# Patient Record
Sex: Female | Born: 1935 | Race: White | Hispanic: No | Smoking: Never smoker
Health system: Southern US, Community
[De-identification: ages and names within clinical notes are randomized; demographics above are authoritative.]

## PROBLEM LIST (undated history)

## (undated) DIAGNOSIS — E785 Hyperlipidemia, unspecified: Secondary | ICD-10-CM

## (undated) DIAGNOSIS — E039 Hypothyroidism, unspecified: Secondary | ICD-10-CM

## (undated) DIAGNOSIS — E119 Type 2 diabetes mellitus without complications: Secondary | ICD-10-CM

## (undated) DIAGNOSIS — N183 Chronic kidney disease, stage 3 unspecified: Secondary | ICD-10-CM

## (undated) DIAGNOSIS — Z95818 Presence of other cardiac implants and grafts: Secondary | ICD-10-CM

## (undated) DIAGNOSIS — I4891 Unspecified atrial fibrillation: Secondary | ICD-10-CM

## (undated) HISTORY — DX: Chronic kidney disease, stage 3 unspecified: N18.30

## (undated) HISTORY — DX: Hyperlipidemia, unspecified: E78.5

## (undated) HISTORY — DX: Hypothyroidism, unspecified: E03.9

## (undated) HISTORY — DX: Presence of other cardiac implants and grafts: Z95.818

## (undated) HISTORY — DX: Type 2 diabetes mellitus without complications: E11.9

## (undated) HISTORY — DX: Unspecified atrial fibrillation: I48.91

---

## 2020-08-13 ENCOUNTER — Other Ambulatory Visit (HOSPITAL_COMMUNITY): Payer: Self-pay

## 2020-08-13 MED ORDER — POTASSIUM CHLORIDE CRYS ER 10 MEQ PO TBCR
EXTENDED_RELEASE_TABLET | ORAL | 0 refills | Status: DC
  Filled 2020-08-13: qty 30, 30d supply, fill #0
  Filled 2020-09-08: qty 30, 30d supply, fill #1
  Filled 2020-10-12: qty 30, 30d supply, fill #2

## 2020-08-13 MED ORDER — METOPROLOL SUCCINATE ER 25 MG PO TB24
25.0000 mg | ORAL_TABLET | Freq: Every day | ORAL | 0 refills | Status: DC
  Filled 2020-08-13: qty 30, 30d supply, fill #0
  Filled 2020-09-08: qty 30, 30d supply, fill #1
  Filled 2020-10-12: qty 30, 30d supply, fill #2

## 2020-08-13 MED ORDER — LEVOTHYROXINE SODIUM 88 MCG PO TABS
ORAL_TABLET | ORAL | 0 refills | Status: DC
  Filled 2020-08-13: qty 30, 30d supply, fill #0
  Filled 2020-09-08: qty 30, 30d supply, fill #1
  Filled 2020-10-12: qty 30, 30d supply, fill #2

## 2020-08-13 MED ORDER — ALIGN 4 MG PO CAPS: ORAL_CAPSULE | ORAL | 0 refills | Status: AC

## 2020-08-13 MED ORDER — SPIRONOLACTONE 25 MG PO TABS
ORAL_TABLET | ORAL | 0 refills | Status: DC
  Filled 2020-08-13: qty 30, 30d supply, fill #0
  Filled 2020-09-08: qty 30, 30d supply, fill #1
  Filled 2020-10-12: qty 30, 30d supply, fill #2

## 2020-08-13 MED ORDER — FARXIGA 5 MG PO TABS
5.0000 mg | ORAL_TABLET | Freq: Every day | ORAL | 0 refills | Status: DC
  Filled 2020-08-13: qty 30, 30d supply, fill #0
  Filled 2020-09-08: qty 30, 30d supply, fill #1
  Filled 2020-10-12: qty 30, 30d supply, fill #2

## 2020-08-13 MED ORDER — LEVOTHYROXINE SODIUM 88 MCG PO CAPS
ORAL_CAPSULE | ORAL | 0 refills | Status: DC
  Filled 2020-08-13: qty 90, 90d supply, fill #0

## 2020-08-13 MED ORDER — TORSEMIDE 10 MG PO TABS
ORAL_TABLET | ORAL | 0 refills | Status: DC
  Filled 2020-08-13: qty 30, 30d supply, fill #0
  Filled 2020-09-08: qty 30, 30d supply, fill #1
  Filled 2020-10-12: qty 30, 30d supply, fill #2

## 2020-08-13 MED ORDER — BUPROPION HCL ER (XL) 150 MG PO TB24
150.0000 mg | ORAL_TABLET | Freq: Every day | ORAL | 0 refills | Status: DC
  Filled 2020-08-13: qty 30, 30d supply, fill #0
  Filled 2020-09-08: qty 30, 30d supply, fill #1
  Filled 2020-10-12: qty 30, 30d supply, fill #2

## 2020-08-14 ENCOUNTER — Other Ambulatory Visit (HOSPITAL_COMMUNITY): Payer: Self-pay

## 2020-08-15 ENCOUNTER — Other Ambulatory Visit (HOSPITAL_COMMUNITY): Payer: Self-pay

## 2020-08-16 ENCOUNTER — Other Ambulatory Visit (HOSPITAL_COMMUNITY): Payer: Self-pay

## 2020-08-18 ENCOUNTER — Other Ambulatory Visit (HOSPITAL_COMMUNITY): Payer: Self-pay

## 2020-08-19 ENCOUNTER — Other Ambulatory Visit (HOSPITAL_COMMUNITY): Payer: Self-pay

## 2020-09-08 ENCOUNTER — Other Ambulatory Visit (HOSPITAL_COMMUNITY): Payer: Self-pay

## 2020-09-15 ENCOUNTER — Other Ambulatory Visit (HOSPITAL_COMMUNITY): Payer: Self-pay

## 2020-09-16 ENCOUNTER — Other Ambulatory Visit (HOSPITAL_COMMUNITY): Payer: Self-pay

## 2020-10-13 ENCOUNTER — Other Ambulatory Visit (HOSPITAL_COMMUNITY): Payer: Self-pay

## 2020-10-14 ENCOUNTER — Other Ambulatory Visit (HOSPITAL_COMMUNITY): Payer: Self-pay

## 2020-10-17 ENCOUNTER — Other Ambulatory Visit (HOSPITAL_COMMUNITY): Payer: Self-pay

## 2020-10-17 MED ORDER — METOPROLOL SUCCINATE ER 25 MG PO TB24
25.0000 mg | ORAL_TABLET | Freq: Every day | ORAL | 1 refills | Status: AC
  Filled 2020-10-17 – 2020-11-11 (×2): qty 30, 30d supply, fill #0
  Filled 2020-12-06: qty 30, 30d supply, fill #1
  Filled 2021-01-13: qty 30, 30d supply, fill #2
  Filled 2021-02-13: qty 30, 30d supply, fill #3
  Filled 2021-03-15: qty 30, 30d supply, fill #4

## 2020-10-17 MED ORDER — SPIRONOLACTONE 25 MG PO TABS
25.0000 mg | ORAL_TABLET | Freq: Every day | ORAL | 1 refills | Status: AC
  Filled 2020-10-17 – 2020-11-11 (×2): qty 30, 30d supply, fill #0
  Filled 2020-12-06: qty 30, 30d supply, fill #1
  Filled 2021-01-13: qty 30, 30d supply, fill #2
  Filled 2021-02-13: qty 30, 30d supply, fill #3
  Filled 2021-03-15: qty 30, 30d supply, fill #4

## 2020-10-17 MED ORDER — TORSEMIDE 10 MG PO TABS
10.0000 mg | ORAL_TABLET | Freq: Every day | ORAL | 1 refills | Status: AC
  Filled 2020-10-17 – 2020-11-11 (×2): qty 30, 30d supply, fill #0
  Filled 2020-12-06: qty 30, 30d supply, fill #1
  Filled 2021-01-13: qty 30, 30d supply, fill #2
  Filled 2021-02-13: qty 30, 30d supply, fill #3
  Filled 2021-03-15: qty 30, 30d supply, fill #4

## 2020-10-17 MED ORDER — MYRBETRIQ 50 MG PO TB24
50.0000 mg | ORAL_TABLET | Freq: Every day | ORAL | 1 refills | Status: AC
  Filled 2020-10-17: qty 30, 30d supply, fill #0
  Filled 2020-11-11: qty 30, 30d supply, fill #1
  Filled 2021-01-13: qty 30, 30d supply, fill #2
  Filled 2021-02-13: qty 30, 30d supply, fill #3
  Filled 2021-03-15: qty 30, 30d supply, fill #4

## 2020-10-17 MED ORDER — ROSUVASTATIN CALCIUM 20 MG PO TABS
20.0000 mg | ORAL_TABLET | Freq: Every day | ORAL | 1 refills | Status: AC
  Filled 2020-10-17: qty 30, 30d supply, fill #0

## 2020-10-17 MED ORDER — METFORMIN HCL ER 500 MG PO TB24
ORAL_TABLET | ORAL | 1 refills | Status: DC
  Filled 2020-10-17: qty 30, 30d supply, fill #0

## 2020-10-17 MED ORDER — BUPROPION HCL ER (XL) 300 MG PO TB24
ORAL_TABLET | ORAL | 1 refills | Status: AC
  Filled 2020-10-17: qty 30, 30d supply, fill #0
  Filled 2020-11-25: qty 30, 30d supply, fill #1
  Filled 2020-12-22: qty 30, 30d supply, fill #2
  Filled 2021-02-01: qty 30, 30d supply, fill #3
  Filled 2021-03-02: qty 30, 30d supply, fill #4

## 2020-10-17 MED ORDER — LEVOTHYROXINE SODIUM 88 MCG PO TABS
ORAL_TABLET | ORAL | 1 refills | Status: AC
  Filled 2020-10-17 – 2020-11-11 (×2): qty 30, 30d supply, fill #0
  Filled 2020-12-08: qty 30, 30d supply, fill #1
  Filled 2021-01-19: qty 30, 30d supply, fill #2
  Filled 2021-02-16: qty 30, 30d supply, fill #3

## 2020-10-17 MED ORDER — DULOXETINE HCL 60 MG PO CPEP
ORAL_CAPSULE | ORAL | 1 refills | Status: AC
  Filled 2020-10-17: qty 30, 30d supply, fill #0
  Filled 2021-01-13: qty 30, 30d supply, fill #1
  Filled 2021-02-13: qty 30, 30d supply, fill #2

## 2020-10-17 MED ORDER — POTASSIUM CHLORIDE ER 10 MEQ PO TBCR
10.0000 meq | EXTENDED_RELEASE_TABLET | Freq: Two times a day (BID) | ORAL | 1 refills | Status: DC
  Filled 2020-10-17: qty 60, 30d supply, fill #0

## 2020-10-17 MED ORDER — GABAPENTIN 300 MG PO CAPS
ORAL_CAPSULE | ORAL | 1 refills | Status: AC
  Filled 2020-10-17: qty 30, 30d supply, fill #0
  Filled 2020-11-14: qty 30, 30d supply, fill #1
  Filled 2020-12-15: qty 30, 30d supply, fill #2
  Filled 2021-01-19: qty 30, 30d supply, fill #3
  Filled 2021-02-23: qty 30, 30d supply, fill #4

## 2020-10-21 ENCOUNTER — Other Ambulatory Visit (HOSPITAL_COMMUNITY): Payer: Self-pay

## 2020-10-21 MED ORDER — POTASSIUM CHLORIDE ER 10 MEQ PO TBCR
EXTENDED_RELEASE_TABLET | ORAL | 1 refills | Status: AC
  Filled 2021-01-14: qty 30, 30d supply, fill #0
  Filled 2021-02-13: qty 30, 30d supply, fill #1
  Filled 2021-03-15: qty 30, 30d supply, fill #2

## 2020-10-22 ENCOUNTER — Other Ambulatory Visit (HOSPITAL_COMMUNITY): Payer: Self-pay

## 2020-10-22 MED ORDER — RYBELSUS 3 MG PO TABS
ORAL_TABLET | ORAL | 0 refills | Status: DC
  Filled 2020-10-22: qty 30, 30d supply, fill #0

## 2020-10-22 MED ORDER — RYBELSUS 7 MG PO TABS
ORAL_TABLET | ORAL | 5 refills | Status: DC
  Filled 2020-10-22: qty 30, 30d supply, fill #0
  Filled 2020-12-22: qty 30, 30d supply, fill #1
  Filled 2021-01-19: qty 30, 30d supply, fill #2

## 2020-11-11 ENCOUNTER — Other Ambulatory Visit (HOSPITAL_COMMUNITY): Payer: Self-pay

## 2020-11-14 ENCOUNTER — Other Ambulatory Visit (HOSPITAL_COMMUNITY): Payer: Self-pay

## 2020-11-25 ENCOUNTER — Other Ambulatory Visit (HOSPITAL_COMMUNITY): Payer: Self-pay

## 2020-12-03 NOTE — Progress Notes (Signed)
Cardiology Office Note:    Date:  12/05/2020   ID:  Kim Gonzalez, DOB 25-Jul-1928, MRN [id number]  PCP:  Pcp, No   [facility] Providers Cardiologist:  None     Referring MD: Wilfrid Lund, PA   No chief complaint on file. Establish care  History of Present Illness:    Kim Gonzalez is a 87 y.o. female with a hx of pAF s/p watchman 2019, T2DM (on metformin) a1c 8.1  (09/2020), CKD, HLD (LDL at goal 61 09/2020), hypothyroidism (TSH 1.3), crt 1.3  who presents to establish care with cardiology  In terms of her cardiac history starts in [place]. It's been many years. She was diagnosed with atrial fibrillation in 2018. Care was with Dr. Maggie Schwalbe [facility] No history of DCCV. She was managed with BB. She was managed on xarelto, unknown why stopped. She underwent watchman device. Dr. Billie Lade. She recalls a possible TEE  6 months later and was told everything looked good.  She was noted to have a pericardial effusion around this time s/p pericardiocentesis. Etiology is unknown.No CHF history. No history CVD. No smoking history. She's asymptomatic and does not know when she is in atrial fibrillation.She was on oxycodone in the past and it cause her to pass out. Otherwise no syncope. She was in NSR in March 2022.   She's taking spironolactone 25 mg daily, torsemide  10 mg daily, metoprolol XL 25 mg, rosuvastatin 20 mg daily  She sits in her chair all day and has started with physical therapy. Lives with her son. She denies angina,mild dyspnea with exercises which improves with water. She denies lower extremity edema, PND or orthopnea.     Current Medications: Current Meds  Medication Sig   buPROPion (WELLBUTRIN XL) 300 MG 24 hr tablet Take 1 tablet by mouth once daily in the morning.   DULoxetine (CYMBALTA) 60 MG capsule Take 1 capsule by mouth once daily.   gabapentin (NEURONTIN) 300 MG capsule Take 1 capsule by mouth once daily at bedtime.   levothyroxine  (SYNTHROID) 88 MCG tablet Take 1 tablet by mouth in the morning on an empty stomach   metFORMIN (GLUCOPHAGE-XR) 500 MG 24 hr tablet Take 1 tablet by mouth with evening meal   metoprolol succinate (TOPROL-XL) 25 MG 24 hr tablet Take 1 tablet (25 mg total) by mouth daily.   mirabegron ER (MYRBETRIQ) 50 MG TB24 tablet Take 1 tablet (50 mg total) by mouth daily.   potassium chloride (KLOR-CON) 10 MEQ tablet Take 1 tablet by mouth once daily with food   Probiotic Product (ALIGN) 4 MG CAPS Take one capsule by mouth daily.   rosuvastatin (CRESTOR) 20 MG tablet Take 1 tablet (20 mg total) by mouth daily.   Semaglutide (RYBELSUS) 7 MG TABS Take 1 tablet by mouth at least 30 minutes before first food, beverage or other oral medicine of the day once daily.Marland Kitchen start one month after taking 3mg  30 day(s)   spironolactone (ALDACTONE) 25 MG tablet Take 1 tablet (25 mg total) by mouth daily.   torsemide (DEMADEX) 10 MG tablet Take 1 tablet (10 mg total) by mouth daily.     Allergies:   Patient has no allergy information on record.   Social History   Socioeconomic History   Marital status: Unknown    Spouse name: Not on file   Number of children: Not on file   Years of education: Not on file   Highest education level: Not on file  Occupational  History   Not on file  Tobacco Use   Smoking status: Never   Smokeless tobacco: Never  Substance and Sexual Activity   Alcohol use: Never   Drug use: Never   Sexual activity: Not Currently  Other Topics Concern   Not on file  Social History Narrative   Not on file   Social Determinants of Health   Financial Resource Strain: Not on file  Food Insecurity: Not on file  Transportation Needs: Not on file  Physical Activity: Not on file  Stress: Not on file  Social Connections: Not on file     Family History: The patient's mother had an MI closed to 26,  sister with heart disease, sister 88 with Alzheimer's dimensia  ROS:   Please see the history of  present illness.     All other systems reviewed and are negative.  EKGs/Labs/Other Studies Reviewed:    The following studies were reviewed today:   EKG:  EKG is  ordered today.  The ekg ordered today demonstrates   12/05/2020- atrial fibrillation rate 90s 05/22/2020- NSR with PACs, Qtc 442 ms  Recent Labs: No results found for requested labs within last 8760 hours.  Recent Lipid Panel No results found for: CHOL, TRIG, HDL, CHOLHDL, VLDL, LDLCALC, LDLDIRECT   Risk Assessment/Calculations:    CHA2DS2-VASc Score = 4   This indicates a 4.8% annual risk of stroke. The patient's score is based upon: CHF History: 0 HTN History: 0 Diabetes History: 1 Stroke History: 0 Vascular Disease History: 0 Age Score: 2 Gender Score: 1         Physical Exam:    VS:  BP 127/78   Pulse 93   Ht 5\' 4"  (1.626 m)   Wt 225 lb (102.1 kg)   SpO2 97%   BMI 38.62 kg/m     Wt Readings from Last 3 Encounters:  12/05/20 225 lb (102.1 kg)     GEN: Sitting in a wheelchair. Conversive. Well nourished, well developed in no acute distress HEENT: Normal NECK: No JVD;  LYMPHATICS: No lymphadenopathy CARDIAC: irregularly irregular, no murmurs, rubs, gallops RESPIRATORY:  Clear to auscultation without rales, wheezing or rhonchi  ABDOMEN: Soft, non-tender, non-distended MUSCULOSKELETAL:  No edema; No deformity  SKIN: Warm and dry NEUROLOGIC:  Alert and oriented x 3 PSYCHIATRIC:  Normal affect   ASSESSMENT:   #Paroxysmal Atrial Fibrillation: s/p watchman 2019 placed in [place]; [place]. She noted a follow up TEE. She is rate controlled an asymptomatic. TSH was normal August, 2022. She was in normal rhythm in March. Will continue rate control for now. We discussed possibility of DCCV and other medications if she developed RVR, CHF etc. She agreed with not aggressively managing at this time.Will get an echo for LV function/LA size etc. -continue BB -no NOAC s/p  watchman  #HTN- well controlled. Continue spironolactone. On low dose torsemide (no noted hx of CHF)  PLAN:    In order of problems listed above:  Complete echocardiogram Follow up in 6 months    Medication Adjustments/Labs and Tests Ordered: Current medicines are reviewed at length with the patient today.  Concerns regarding medicines are outlined above.    Signed, April, MD  12/05/2020 11:56 AM    [facility]

## 2020-12-05 ENCOUNTER — Other Ambulatory Visit: Payer: Self-pay

## 2020-12-05 ENCOUNTER — Ambulatory Visit (INDEPENDENT_AMBULATORY_CARE_PROVIDER_SITE_OTHER): Payer: Medicare Other | Admitting: Internal Medicine

## 2020-12-05 ENCOUNTER — Encounter: Payer: Self-pay | Admitting: Internal Medicine

## 2020-12-05 VITALS — BP 127/78 | HR 93 | Ht 64.0 in | Wt 225.0 lb

## 2020-12-05 DIAGNOSIS — I4891 Unspecified atrial fibrillation: Secondary | ICD-10-CM | POA: Insufficient documentation

## 2020-12-05 DIAGNOSIS — Z95818 Presence of other cardiac implants and grafts: Secondary | ICD-10-CM | POA: Diagnosis not present

## 2020-12-05 DIAGNOSIS — Z7689 Persons encountering health services in other specified circumstances: Secondary | ICD-10-CM | POA: Diagnosis not present

## 2020-12-05 DIAGNOSIS — E669 Obesity, unspecified: Secondary | ICD-10-CM | POA: Insufficient documentation

## 2020-12-05 DIAGNOSIS — I10 Essential (primary) hypertension: Secondary | ICD-10-CM | POA: Insufficient documentation

## 2020-12-05 NOTE — Patient Instructions (Signed)
Medication Instructions:  CONTINUE WITH MEDICATIONS *If you need a refill on your cardiac medications before your next appointment, please call your pharmacy*   Testing/Procedures: Your physician has requested that you have an echocardiogram. Echocardiography is a painless test that uses sound waves to create images of your heart. It provides your doctor with information about the size and shape of your heart and how well your heart's chambers and valves are working. This procedure takes approximately one hour. There are no restrictions for this procedure. [address]   Follow-Up: At [facility], you and your health needs are our priority.  As part of our continuing mission to provide you with exceptional heart care, we have created designated Provider Care Teams.  These Care Teams include your primary Cardiologist (physician) and Advanced Practice Providers (APPs -  Physician Assistants and Nurse Practitioners) who all work together to provide you with the care you need, when you need it.  We recommend signing up for the patient portal called "MyChart".  Sign up information is provided on this After Visit Summary.  MyChart is used to connect with patients for Virtual Visits (Telemedicine).  Patients are able to view lab/test results, encounter notes, upcoming appointments, etc.  Non-urgent messages can be sent to your provider as well.   To learn more about what you can do with MyChart, go to [email or web address].    Your next appointment:   6 month(s)  The format for your next appointment:   In Person  Provider:   DR. Wyline Mood

## 2020-12-08 ENCOUNTER — Other Ambulatory Visit (HOSPITAL_COMMUNITY): Payer: Self-pay

## 2020-12-10 ENCOUNTER — Other Ambulatory Visit (HOSPITAL_COMMUNITY): Payer: Self-pay

## 2020-12-11 ENCOUNTER — Other Ambulatory Visit (HOSPITAL_COMMUNITY): Payer: Self-pay

## 2020-12-16 ENCOUNTER — Other Ambulatory Visit (HOSPITAL_COMMUNITY): Payer: Self-pay

## 2020-12-19 ENCOUNTER — Other Ambulatory Visit: Payer: Self-pay

## 2020-12-19 ENCOUNTER — Ambulatory Visit (HOSPITAL_COMMUNITY): Payer: Medicare Other | Attending: Cardiology

## 2020-12-19 DIAGNOSIS — I4891 Unspecified atrial fibrillation: Secondary | ICD-10-CM | POA: Insufficient documentation

## 2020-12-19 LAB — ECHOCARDIOGRAM COMPLETE: S' Lateral: 3.4 cm

## 2020-12-22 ENCOUNTER — Other Ambulatory Visit (HOSPITAL_COMMUNITY): Payer: Self-pay

## 2020-12-30 ENCOUNTER — Encounter (HOSPITAL_COMMUNITY): Payer: Self-pay

## 2020-12-30 ENCOUNTER — Emergency Department (HOSPITAL_COMMUNITY): Payer: Medicare Other

## 2020-12-30 ENCOUNTER — Emergency Department (HOSPITAL_COMMUNITY)
Admission: EM | Admit: 2020-12-30 | Discharge: 2020-12-30 | Disposition: A | Discharge status: Home / Self Care | Payer: Medicare Other | Attending: Physician Assistant | Admitting: Physician Assistant

## 2020-12-30 ENCOUNTER — Other Ambulatory Visit (HOSPITAL_COMMUNITY): Payer: Self-pay

## 2020-12-30 DIAGNOSIS — M6281 Muscle weakness (generalized): Secondary | ICD-10-CM | POA: Diagnosis not present

## 2020-12-30 DIAGNOSIS — Z5321 Procedure and treatment not carried out due to patient leaving prior to being seen by health care provider: Secondary | ICD-10-CM | POA: Diagnosis not present

## 2020-12-30 DIAGNOSIS — R3981 Functional urinary incontinence: Secondary | ICD-10-CM | POA: Diagnosis not present

## 2020-12-30 DIAGNOSIS — R350 Frequency of micturition: Secondary | ICD-10-CM | POA: Insufficient documentation

## 2020-12-30 DIAGNOSIS — Z993 Dependence on wheelchair: Secondary | ICD-10-CM | POA: Diagnosis not present

## 2020-12-30 DIAGNOSIS — Z5181 Encounter for therapeutic drug level monitoring: Secondary | ICD-10-CM | POA: Diagnosis not present

## 2020-12-30 DIAGNOSIS — R82998 Other abnormal findings in urine: Secondary | ICD-10-CM | POA: Insufficient documentation

## 2020-12-30 DIAGNOSIS — R079 Chest pain, unspecified: Secondary | ICD-10-CM | POA: Diagnosis present

## 2020-12-30 LAB — CBC WITH DIFFERENTIAL/PLATELET
Abs Immature Granulocytes: 0.09 10*3/uL — ABNORMAL HIGH (ref 0.00–0.07)
Basophils Absolute: 0.1 10*3/uL (ref 0.0–0.1)
Basophils Relative: 0 %
Eosinophils Absolute: 0 10*3/uL (ref 0.0–0.5)
Eosinophils Relative: 0 %
HCT: 45.8 % (ref 36.0–46.0)
Hemoglobin: 14.5 g/dL (ref 12.0–15.0)
Immature Granulocytes: 1 %
Lymphocytes Relative: 20 %
Lymphs Abs: 2.9 10*3/uL (ref 0.7–4.0)
MCH: 30.1 pg (ref 26.0–34.0)
MCHC: 31.7 g/dL (ref 30.0–36.0)
MCV: 95 fL (ref 80.0–100.0)
Monocytes Absolute: 2 10*3/uL — ABNORMAL HIGH (ref 0.1–1.0)
Monocytes Relative: 14 %
Neutro Abs: 9.6 10*3/uL — ABNORMAL HIGH (ref 1.7–7.7)
Neutrophils Relative %: 65 %
Platelets: 204 10*3/uL (ref 150–400)
RBC: 4.82 MIL/uL (ref 3.87–5.11)
RDW: 13.1 % (ref 11.5–15.5)
WBC: 14.7 10*3/uL — ABNORMAL HIGH (ref 4.0–10.5)
nRBC: 0 % (ref 0.0–0.2)

## 2020-12-30 LAB — COMPREHENSIVE METABOLIC PANEL
ALT: 12 U/L (ref 0–44)
AST: 16 U/L (ref 15–41)
Albumin: 3.6 g/dL (ref 3.5–5.0)
Alkaline Phosphatase: 76 U/L (ref 38–126)
Anion gap: 16 — ABNORMAL HIGH (ref 5–15)
BUN: 33 mg/dL — ABNORMAL HIGH (ref 8–23)
CO2: 17 mmol/L — ABNORMAL LOW (ref 22–32)
Calcium: 9.2 mg/dL (ref 8.9–10.3)
Chloride: 105 mmol/L (ref 98–111)
Creatinine, Ser: 1.92 mg/dL — ABNORMAL HIGH (ref 0.44–1.00)
GFR, Estimated: 25 mL/min — ABNORMAL LOW (ref >60)
Glucose, Bld: 157 mg/dL — ABNORMAL HIGH (ref 70–99)
Potassium: 5.2 mmol/L — ABNORMAL HIGH (ref 3.5–5.1)
Sodium: 138 mmol/L (ref 135–145)
Total Bilirubin: 1.8 mg/dL — ABNORMAL HIGH (ref 0.3–1.2)
Total Protein: 7.8 g/dL (ref 6.5–8.1)

## 2020-12-30 LAB — ACETAMINOPHEN LEVEL: Acetaminophen (Tylenol), Serum: 17 ug/mL (ref 10–30)

## 2020-12-30 LAB — LIPASE, BLOOD: Lipase: 43 U/L (ref 11–51)

## 2020-12-30 MED ORDER — CEPHALEXIN 500 MG PO CAPS
ORAL_CAPSULE | ORAL | 0 refills | Status: DC
  Filled 2020-12-30: qty 14, 7d supply, fill #0

## 2020-12-30 NOTE — ED Provider Notes (Addendum)
Emergency Medicine Provider Triage Evaluation Note  Kim Gonzalez , a 87 y.o. female  was evaluated in triage.  Pt complains of generalized weakness x 2 weeks. Foul urine along with difficulty transferring from her wheel chair. Son at the bedside states, she has been more difficult to take care of lately. No sick contacts. Pt is incontinent at baseline, she reports this has not worsened.  Review of Systems  Positive: Weakness, urinary symptoms Negative: Fever, dizziness, back pain  Physical Exam  BP (!) 149/60 (BP Location: Left Arm)   Pulse 100   Temp 98.3 F (36.8 C) (Oral)   Resp 20   SpO2 99%  Gen:   Awake, no distress   Resp:  Normal effort  MSK:   Moves extremities without difficulty  Other:    Medical Decision Making  Medically screening exam initiated at 12:16 PM.  Appropriate orders placed.  Vina Byrd was informed that the remainder of the evaluation will be completed by another provider, this initial triage assessment does not replace that evaluation, and the importance of remaining in the ED until their evaluation is complete.  Patient here with urinary symptoms along with generalized weakness.  She is incontinence at baseline, reports she cannot provide a specimen on her own.  Will likely need pure wick once she arrives to the back.  Vitals are within normal limits, without any chest pain or shortness of breath.  Son does report she has been taking a lot of Tylenol, will also check this level.   Claude Manges, PA-C 12/30/20 1222    Arby Barrette, MD 12/30/20 1251    Claude Manges, PA-C 02/09/21 1725    Arby Barrette, MD 02/13/21 2101

## 2020-12-30 NOTE — ED Triage Notes (Signed)
Pt arrived via POV, per family, recent urine has been darker and with foul odor. Pt incontinent at baseline. Denies any pain or fevers at home.

## 2021-01-01 ENCOUNTER — Other Ambulatory Visit (HOSPITAL_COMMUNITY): Payer: Self-pay

## 2021-01-01 MED ORDER — LEVOFLOXACIN 500 MG PO TABS
250.0000 mg | ORAL_TABLET | Freq: Every day | ORAL | 0 refills | Status: DC
  Filled 2021-01-01: qty 3, 5d supply, fill #0

## 2021-01-13 ENCOUNTER — Other Ambulatory Visit (HOSPITAL_COMMUNITY): Payer: Self-pay

## 2021-01-14 ENCOUNTER — Other Ambulatory Visit (HOSPITAL_COMMUNITY): Payer: Self-pay

## 2021-01-19 ENCOUNTER — Other Ambulatory Visit (HOSPITAL_COMMUNITY): Payer: Self-pay

## 2021-01-27 ENCOUNTER — Other Ambulatory Visit (HOSPITAL_COMMUNITY): Payer: Self-pay

## 2021-02-02 ENCOUNTER — Other Ambulatory Visit (HOSPITAL_COMMUNITY): Payer: Self-pay

## 2021-02-13 ENCOUNTER — Other Ambulatory Visit (HOSPITAL_COMMUNITY): Payer: Self-pay

## 2021-02-17 ENCOUNTER — Other Ambulatory Visit (HOSPITAL_COMMUNITY): Payer: Self-pay

## 2021-02-23 ENCOUNTER — Other Ambulatory Visit (HOSPITAL_COMMUNITY): Payer: Self-pay

## 2021-02-23 MED ORDER — DOXYCYCLINE HYCLATE 100 MG PO TABS
ORAL_TABLET | ORAL | 0 refills | Status: DC
  Filled 2021-02-23: qty 14, 7d supply, fill #0

## 2021-03-02 ENCOUNTER — Other Ambulatory Visit (HOSPITAL_COMMUNITY): Payer: Self-pay

## 2021-03-05 ENCOUNTER — Other Ambulatory Visit (HOSPITAL_COMMUNITY): Payer: Self-pay

## 2021-03-16 ENCOUNTER — Other Ambulatory Visit (HOSPITAL_COMMUNITY): Payer: Self-pay

## 2021-03-17 ENCOUNTER — Other Ambulatory Visit (HOSPITAL_COMMUNITY): Payer: Self-pay

## 2021-03-19 ENCOUNTER — Inpatient Hospital Stay (HOSPITAL_COMMUNITY)
Admission: EM | Admit: 2021-03-19 | Discharge: 2021-03-24 | DRG: 536 | Disposition: A | Discharge status: Skilled Nursing Facility | Payer: Medicare Other | Attending: Family Medicine | Admitting: Family Medicine

## 2021-03-19 ENCOUNTER — Encounter (HOSPITAL_COMMUNITY): Payer: Self-pay

## 2021-03-19 ENCOUNTER — Other Ambulatory Visit: Payer: Self-pay

## 2021-03-19 ENCOUNTER — Emergency Department (HOSPITAL_COMMUNITY): Payer: Medicare Other

## 2021-03-19 DIAGNOSIS — S32612A Displaced avulsion fracture of left ischium, initial encounter for closed fracture: Principal | ICD-10-CM | POA: Diagnosis present

## 2021-03-19 DIAGNOSIS — Z882 Allergy status to sulfonamides status: Secondary | ICD-10-CM

## 2021-03-19 DIAGNOSIS — Z886 Allergy status to analgesic agent status: Secondary | ICD-10-CM

## 2021-03-19 DIAGNOSIS — E119 Type 2 diabetes mellitus without complications: Secondary | ICD-10-CM

## 2021-03-19 DIAGNOSIS — I10 Essential (primary) hypertension: Secondary | ICD-10-CM

## 2021-03-19 DIAGNOSIS — I4891 Unspecified atrial fibrillation: Secondary | ICD-10-CM | POA: Diagnosis present

## 2021-03-19 DIAGNOSIS — N3289 Other specified disorders of bladder: Secondary | ICD-10-CM

## 2021-03-19 DIAGNOSIS — I129 Hypertensive chronic kidney disease with stage 1 through stage 4 chronic kidney disease, or unspecified chronic kidney disease: Secondary | ICD-10-CM | POA: Diagnosis present

## 2021-03-19 DIAGNOSIS — S32613A Displaced avulsion fracture of unspecified ischium, initial encounter for closed fracture: Secondary | ICD-10-CM | POA: Diagnosis not present

## 2021-03-19 DIAGNOSIS — Z881 Allergy status to other antibiotic agents status: Secondary | ICD-10-CM

## 2021-03-19 DIAGNOSIS — Z993 Dependence on wheelchair: Secondary | ICD-10-CM

## 2021-03-19 DIAGNOSIS — Z20822 Contact with and (suspected) exposure to covid-19: Secondary | ICD-10-CM | POA: Diagnosis present

## 2021-03-19 DIAGNOSIS — M25552 Pain in left hip: Secondary | ICD-10-CM | POA: Diagnosis not present

## 2021-03-19 DIAGNOSIS — Z888 Allergy status to other drugs, medicaments and biological substances status: Secondary | ICD-10-CM

## 2021-03-19 DIAGNOSIS — B952 Enterococcus as the cause of diseases classified elsewhere: Secondary | ICD-10-CM | POA: Diagnosis present

## 2021-03-19 DIAGNOSIS — Z79890 Hormone replacement therapy (postmenopausal): Secondary | ICD-10-CM

## 2021-03-19 DIAGNOSIS — R300 Dysuria: Secondary | ICD-10-CM

## 2021-03-19 DIAGNOSIS — Z885 Allergy status to narcotic agent status: Secondary | ICD-10-CM

## 2021-03-19 DIAGNOSIS — Z880 Allergy status to penicillin: Secondary | ICD-10-CM

## 2021-03-19 DIAGNOSIS — Z7984 Long term (current) use of oral hypoglycemic drugs: Secondary | ICD-10-CM

## 2021-03-19 DIAGNOSIS — Z66 Do not resuscitate: Secondary | ICD-10-CM | POA: Diagnosis present

## 2021-03-19 DIAGNOSIS — N1831 Chronic kidney disease, stage 3a: Secondary | ICD-10-CM | POA: Diagnosis present

## 2021-03-19 DIAGNOSIS — W19XXXA Unspecified fall, initial encounter: Secondary | ICD-10-CM | POA: Diagnosis present

## 2021-03-19 DIAGNOSIS — Z95818 Presence of other cardiac implants and grafts: Secondary | ICD-10-CM | POA: Diagnosis present

## 2021-03-19 DIAGNOSIS — B9562 Methicillin resistant Staphylococcus aureus infection as the cause of diseases classified elsewhere: Secondary | ICD-10-CM | POA: Diagnosis present

## 2021-03-19 DIAGNOSIS — Y92012 Bathroom of single-family (private) house as the place of occurrence of the external cause: Secondary | ICD-10-CM

## 2021-03-19 DIAGNOSIS — Z7985 Long-term (current) use of injectable non-insulin antidiabetic drugs: Secondary | ICD-10-CM

## 2021-03-19 DIAGNOSIS — I480 Paroxysmal atrial fibrillation: Secondary | ICD-10-CM | POA: Diagnosis present

## 2021-03-19 DIAGNOSIS — N390 Urinary tract infection, site not specified: Secondary | ICD-10-CM | POA: Insufficient documentation

## 2021-03-19 DIAGNOSIS — R7989 Other specified abnormal findings of blood chemistry: Secondary | ICD-10-CM

## 2021-03-19 DIAGNOSIS — E785 Hyperlipidemia, unspecified: Secondary | ICD-10-CM | POA: Diagnosis present

## 2021-03-19 DIAGNOSIS — E039 Hypothyroidism, unspecified: Secondary | ICD-10-CM

## 2021-03-19 DIAGNOSIS — G8929 Other chronic pain: Secondary | ICD-10-CM

## 2021-03-19 DIAGNOSIS — S72002A Fracture of unspecified part of neck of left femur, initial encounter for closed fracture: Secondary | ICD-10-CM

## 2021-03-19 DIAGNOSIS — N3281 Overactive bladder: Secondary | ICD-10-CM | POA: Insufficient documentation

## 2021-03-19 DIAGNOSIS — R8271 Bacteriuria: Secondary | ICD-10-CM

## 2021-03-19 DIAGNOSIS — Z79899 Other long term (current) drug therapy: Secondary | ICD-10-CM

## 2021-03-19 DIAGNOSIS — W1830XA Fall on same level, unspecified, initial encounter: Secondary | ICD-10-CM | POA: Diagnosis present

## 2021-03-19 DIAGNOSIS — S76912A Strain of unspecified muscles, fascia and tendons at thigh level, left thigh, initial encounter: Secondary | ICD-10-CM | POA: Diagnosis present

## 2021-03-19 DIAGNOSIS — E1122 Type 2 diabetes mellitus with diabetic chronic kidney disease: Secondary | ICD-10-CM | POA: Diagnosis present

## 2021-03-19 LAB — CBC
HCT: 41.3 % (ref 36.0–46.0)
Hemoglobin: 13.3 g/dL (ref 12.0–15.0)
MCH: 30.9 pg (ref 26.0–34.0)
MCHC: 32.2 g/dL (ref 30.0–36.0)
MCV: 96 fL (ref 80.0–100.0)
Platelets: 212 10*3/uL (ref 150–400)
RBC: 4.3 MIL/uL (ref 3.87–5.11)
RDW: 13.4 % (ref 11.5–15.5)
WBC: 8 10*3/uL (ref 4.0–10.5)
nRBC: 0 % (ref 0.0–0.2)

## 2021-03-19 LAB — BASIC METABOLIC PANEL
Anion gap: 9 (ref 5–15)
BUN: 28 mg/dL — ABNORMAL HIGH (ref 8–23)
CO2: 25 mmol/L (ref 22–32)
Calcium: 9.3 mg/dL (ref 8.9–10.3)
Chloride: 101 mmol/L (ref 98–111)
Creatinine, Ser: 1.28 mg/dL — ABNORMAL HIGH (ref 0.44–1.00)
GFR, Estimated: 40 mL/min — ABNORMAL LOW (ref >60)
Glucose, Bld: 125 mg/dL — ABNORMAL HIGH (ref 70–99)
Potassium: 4.3 mmol/L (ref 3.5–5.1)
Sodium: 135 mmol/L (ref 135–145)

## 2021-03-19 LAB — RESP PANEL BY RT-PCR (FLU A&B, COVID) ARPGX2
Influenza A by PCR: NEGATIVE
Influenza B by PCR: NEGATIVE
SARS Coronavirus 2 by RT PCR: NEGATIVE

## 2021-03-19 LAB — CREATININE, SERUM
Creatinine, Ser: 1.26 mg/dL — ABNORMAL HIGH (ref 0.44–1.00)
GFR, Estimated: 41 mL/min — ABNORMAL LOW (ref >60)

## 2021-03-19 MED ORDER — BUPROPION HCL ER (XL) 300 MG PO TB24
300.0000 mg | ORAL_TABLET | Freq: Every day | ORAL | Status: DC
  Administered 2021-03-20 – 2021-03-24 (×5): 300 mg via ORAL
  Filled 2021-03-19 (×5): qty 1

## 2021-03-19 MED ORDER — LIDOCAINE 5 % EX PTCH
1.0000 | MEDICATED_PATCH | CUTANEOUS | Status: DC
  Administered 2021-03-19 – 2021-03-23 (×5): 1 via TRANSDERMAL
  Filled 2021-03-19 (×6): qty 1

## 2021-03-19 MED ORDER — GABAPENTIN 300 MG PO CAPS
300.0000 mg | ORAL_CAPSULE | Freq: Every day | ORAL | Status: DC
  Administered 2021-03-19 – 2021-03-23 (×5): 300 mg via ORAL
  Filled 2021-03-19 (×5): qty 1

## 2021-03-19 MED ORDER — ACETAMINOPHEN 500 MG PO TABS
500.0000 mg | ORAL_TABLET | Freq: Four times a day (QID) | ORAL | Status: AC
  Administered 2021-03-19 – 2021-03-22 (×11): 500 mg via ORAL
  Filled 2021-03-19 (×11): qty 1

## 2021-03-19 MED ORDER — LEVOTHYROXINE SODIUM 88 MCG PO TABS
88.0000 ug | ORAL_TABLET | Freq: Every day | ORAL | Status: DC
  Administered 2021-03-20 – 2021-03-24 (×5): 88 ug via ORAL
  Filled 2021-03-19 (×5): qty 1

## 2021-03-19 MED ORDER — HYDROCODONE-ACETAMINOPHEN 5-325 MG PO TABS
1.0000 | ORAL_TABLET | Freq: Four times a day (QID) | ORAL | Status: DC | PRN
  Administered 2021-03-19 – 2021-03-24 (×11): 1 via ORAL
  Filled 2021-03-19 (×11): qty 1

## 2021-03-19 MED ORDER — ROSUVASTATIN CALCIUM 20 MG PO TABS
20.0000 mg | ORAL_TABLET | Freq: Every day | ORAL | Status: DC
  Administered 2021-03-20 – 2021-03-24 (×5): 20 mg via ORAL
  Filled 2021-03-19 (×5): qty 1

## 2021-03-19 MED ORDER — METOPROLOL SUCCINATE ER 25 MG PO TB24
25.0000 mg | ORAL_TABLET | Freq: Every day | ORAL | Status: DC
  Administered 2021-03-20 – 2021-03-24 (×5): 25 mg via ORAL
  Filled 2021-03-19 (×5): qty 1

## 2021-03-19 MED ORDER — MIRABEGRON ER 25 MG PO TB24
50.0000 mg | ORAL_TABLET | Freq: Every day | ORAL | Status: DC
Start: 2021-03-20 — End: 2021-03-24
  Administered 2021-03-20 – 2021-03-24 (×5): 50 mg via ORAL
  Filled 2021-03-19 (×5): qty 2

## 2021-03-19 MED ORDER — DICLOFENAC SODIUM 1 % EX GEL
4.0000 g | Freq: Four times a day (QID) | CUTANEOUS | Status: DC
  Administered 2021-03-19 – 2021-03-24 (×17): 4 g via TOPICAL
  Filled 2021-03-19: qty 100

## 2021-03-19 MED ORDER — ACETAMINOPHEN 500 MG PO TABS
1000.0000 mg | ORAL_TABLET | Freq: Four times a day (QID) | ORAL | Status: DC | PRN
  Administered 2021-03-19: 1000 mg via ORAL
  Filled 2021-03-19: qty 2

## 2021-03-19 MED ORDER — ACETAMINOPHEN 325 MG PO TABS: 650.0000 mg | ORAL_TABLET | Freq: Four times a day (QID) | ORAL | Status: DC | PRN

## 2021-03-19 MED ORDER — DULOXETINE HCL 60 MG PO CPEP
60.0000 mg | ORAL_CAPSULE | Freq: Every day | ORAL | Status: DC
  Administered 2021-03-20 – 2021-03-24 (×5): 60 mg via ORAL
  Filled 2021-03-19 (×5): qty 1

## 2021-03-19 MED ORDER — POLYETHYLENE GLYCOL 3350 17 G PO PACK
17.0000 g | PACK | Freq: Every day | ORAL | Status: DC | PRN
Start: 2021-03-19 — End: 2021-03-24

## 2021-03-19 MED ORDER — ENOXAPARIN SODIUM 40 MG/0.4ML IJ SOSY
40.0000 mg | PREFILLED_SYRINGE | INTRAMUSCULAR | Status: DC
  Administered 2021-03-19 – 2021-03-24 (×6): 40 mg via SUBCUTANEOUS
  Filled 2021-03-19 (×6): qty 0.4

## 2021-03-19 NOTE — Hospital Course (Addendum)
88 yo with hx afib s/p watchman, CKD, diabetes, hypothyroidism, and multiple other medical problems who presents 7 days after recurrent falls.  She's found to have left ischial tuberosity avulsion fracture and near complete non retracted tears of the L hamstring tendon origin.  Non operative per ortho.  Hospitalization complicated by dysuria and urine positive for UTI, she's been treated with antibiotics.  Plan for SNF, likely 1/31.  See below for additional details

## 2021-03-19 NOTE — Assessment & Plan Note (Signed)
2019, s/p watchman Continue beta blocker

## 2021-03-19 NOTE — Assessment & Plan Note (Addendum)
No longer taking any meds, follow a1c 6.3

## 2021-03-19 NOTE — Assessment & Plan Note (Signed)
myrbetriq

## 2021-03-19 NOTE — ED Triage Notes (Addendum)
Pt arrived via [facility] coming from home.   C/o left hip pain 9/10 with standing and sitting.  Pt fell about 5 daysago and refused treatment with ems and transport.   Today pain is worse and family wanted pt transported.   No shortening or deformity noted by ems.   136/92 Hr-80 Rr-19 97.3 94% RA  A/Ox4 Ambulatory with 2 assist Pt is hard of hearing

## 2021-03-19 NOTE — H&P (Signed)
History and Physical    Kim Gonzalez [id number] DOB: 1928-05-22 DOA: 03/19/2021  PCP: [name], PA  Patient coming from: home  I have personally briefly reviewed patient's old medical records in [place]  Chief Complaint: L hip pain  HPI: Kim Gonzalez is Kim Gonzalez 88 y.o. female with hx afib s/p watchman, CKD, diabetes, hypothyroidism, and multiple other medical problems who presents 7 days after recurrent falls.  At baseline, she is wheelchair bound, but can transfer.  Typically transfers 3-4 steps to the toilet.  When she was transferring on this occasion, her grip slipped and she fell down on her L side.  No LOC, no head trauma.  Her son helped her up on the first occasion.  She's been in significant and continued pain since that time.  On Friday, she had another fall and Tonea Leiphart caregiver helped ease her down gently.  They called EMS, but she refused transfer to ED.  She's been unable to bear weight on her left foot since the fall and the family wanted her to be brought in with concern for their ability to continue to care for her.  Denies CP, fevers, chills, SOB, nausea, vomiting, abdominal pain.  Denies smoking, denies, etoh.     ED Course: imaging, admit to hospitalist  Review of Systems: As per HPI otherwise all other systems reviewed and are negative.  Past Medical History:  Diagnosis Date   Atrial fibrillation ([place])    CKD (chronic kidney disease), symptom management only, stage 3 (moderate) (HCC)    Diabetes ([place])    Hyperlipidemia    Hypothyroidism    Presence of Watchman left atrial appendage closure device     History reviewed. No pertinent surgical history.  Social History  reports that she has never smoked. She has never used smokeless tobacco. She reports that she does not drink alcohol and does not use drugs.  Allergies  Allergen Reactions   Amoxicillin Diarrhea   Aspirin     unknown   Ciprofibrate     unknown   Dilantin [Phenytoin]     unknown    Doxycycline Hives   Erythromycin     Other reaction(s): UPSET STOMACH   Fentanyl     Other reaction(s): altered mental states   Ibuprofen     Upset stomach    Lovastatin     unknown   Macrodantin [Nitrofurantoin]     unknown   Oxycodone     Other reaction(s): unbalanced   Oxycodone-Acetaminophen     Upset stomach    Sulfamethoxazole-Trimethoprim     Stomach pain    History reviewed. No pertinent family history.   Prior to Admission medications   Medication Sig Start Date End Date Taking? Authorizing Provider  buPROPion (WELLBUTRIN XL) 300 MG 24 hr tablet Take 1 tablet by mouth once daily in the morning. Patient taking differently: Take 300 mg by mouth daily. 10/17/20  Yes   DULoxetine (CYMBALTA) 60 MG capsule Take 1 capsule by mouth once daily. Patient taking differently: Take 60 mg by mouth daily. 10/17/20  Yes   gabapentin (NEURONTIN) 300 MG capsule Take 1 capsule by mouth once daily at bedtime. Patient taking differently: Take 300 mg by mouth at bedtime. 10/17/20  Yes   levothyroxine (SYNTHROID) 88 MCG tablet Take 1 tablet by mouth in the morning on an empty stomach 10/17/20  Yes   metoprolol succinate (TOPROL-XL) 25 MG 24 hr tablet Take 1 tablet (25 mg total) by mouth daily. 10/17/20  Yes  mirabegron ER (MYRBETRIQ) 50 MG TB24 tablet Take 1 tablet (50 mg total) by mouth daily. 10/17/20  Yes   potassium chloride (KLOR-CON) 10 MEQ tablet Take 1 tablet by mouth once daily with food 10/21/20  Yes   Probiotic Product (ALIGN) 4 MG CAPS Take one capsule by mouth daily. Patient taking differently: Take 1 capsule by mouth daily. 08/13/20  Yes   rosuvastatin (CRESTOR) 20 MG tablet Take 1 tablet (20 mg total) by mouth daily. 10/17/20  Yes   spironolactone (ALDACTONE) 25 MG tablet Take 1 tablet (25 mg total) by mouth daily. 10/17/20  Yes   torsemide (DEMADEX) 10 MG tablet Take 1 tablet (10 mg total) by mouth daily. 10/17/20  Yes   cephALEXin (KEFLEX) 500 MG capsule Take 1 capsule by mouth 2 times  Rajeev Escue day for 7 days Patient not taking: Reported on 03/19/2021 12/30/20     doxycycline (VIBRA-TABS) 100 MG tablet Take 1 tablet by mouth every 12 hours for 7 days. Patient not taking: Reported on 03/19/2021 02/23/21     levofloxacin (LEVAQUIN) 500 MG tablet Take 1/2 tablet (250 mg total) by mouth daily for 5 days. Patient not taking: Reported on 03/19/2021 01/01/21       Physical Exam: Vitals:   03/19/21 1600 03/19/21 1630 03/19/21 1645 03/19/21 1726  BP: (!) 158/96 (!) 167/93 (!) 174/87 (!) 143/77  Pulse: 94 (!) 103 100 92  Resp:  18  20  Temp:    98.4 F (36.9 C)  TempSrc:    Oral  SpO2: (!) 85% 96% 98% 99%    Constitutional: NAD, calm, comfortable Vitals:   03/19/21 1600 03/19/21 1630 03/19/21 1645 03/19/21 1726  BP: (!) 158/96 (!) 167/93 (!) 174/87 (!) 143/77  Pulse: 94 (!) 103 100 92  Resp:  18  20  Temp:    98.4 F (36.9 C)  TempSrc:    Oral  SpO2: (!) 85% 96% 98% 99%   Eyes: PERRL, lids and conjunctivae normal ENMT: Mucous membranes are moist. Posterior pharynx clear of any exudate or lesions.Normal dentition.  Neck: normal, supple, no masses, no thyromegaly Respiratory: clear to auscultation bilaterally, no wheezing, no crackles. Normal respiratory effort. No accessory muscle use.  Cardiovascular: Regular rate and rhythm, no murmurs / rubs / gallops. No extremity edema. 2+ pedal pulses. No carotid bruits.  Abdomen: no tenderness, no masses palpated. No hepatosplenomegaly. Bowel sounds positive.  Musculoskeletal: no clubbing / cyanosis. No joint deformity upper and lower extremities. Good ROM, no contractures. Normal muscle tone.  Skin: no rashes, lesions, ulcers. No induration Neurologic: CN 2-12 grossly intact. Sensation intact, DTR normal. Strength 5/5 in all 4.  Psychiatric: Normal judgment and insight. Alert and oriented x 3. Normal mood.   Labs on Admission: I have personally reviewed following labs and imaging studies  CBC: Recent Labs  Lab 03/19/21 1820  WBC 8.0   HGB 13.3  HCT 41.3  MCV 96.0  PLT 99991111    Basic Metabolic Panel: Recent Labs  Lab 03/19/21 1820  CREATININE 1.26*    GFR: CrCl cannot be calculated (Unknown ideal weight.).  Liver Function Tests: No results for input(s): AST, ALT, ALKPHOS, BILITOT, PROT, ALBUMIN in the last 168 hours.  Urine analysis: No results found for: COLORURINE, APPEARANCEUR, LABSPEC, [place], GLUCOSEU, HGBUR, BILIRUBINUR, KETONESUR, PROTEINUR, UROBILINOGEN, NITRITE, LEUKOCYTESUR  Radiological Exams on Admission: MR HIP LEFT WO CONTRAST  Result Date: 03/19/2021 CLINICAL DATA:  Left hip pain.  Fall 5 days ago EXAM: MR OF THE LEFT HIP WITHOUT CONTRAST  TECHNIQUE: Multiplanar, multisequence MR imaging was performed. No intravenous contrast was administered. COMPARISON:  X-ray 03/19/2021 FINDINGS: Bones: Acute avulsion fracture of the left ischial tuberosity with an approximately 4.0 x 1.2 cm minimally displaced fracture fragment (series 8, image 15). Marked bone marrow edema within the left ischium. Near-complete non-retracted tears of the left hamstring tendon origin. No additional fracture. No dislocation. No femoral head avascular necrosis. No pelvic diastasis. Degenerative disc disease of the visualized lumbar spine. No marrow replacing bone lesion. Articular cartilage and labrum Articular cartilage:  Mild chondral thinning. Labrum:  Grossly intact on non-arthrographic imaging. Joint or bursal effusion Joint effusion:  None. Bursae: No abnormal bursal fluid collection. Muscles and tendons Muscles and tendons: Near complete left hamstring avulsion. The gluteal, iliopsoas, rectus femoris, and adductor tendons appear intact. Generalized muscle atrophy. Fatty infiltration of the bilateral gluteal musculature. Other findings Miscellaneous: Small ill-defined hematoma at the fracture site. No inguinal lymphadenopathy. Scattered diverticular changes within the visualized sigmoid colon. IMPRESSION: 1. Acute avulsion fracture  of the left ischial tuberosity with an approximately 4.0 x 1.2 cm minimally displaced fracture fragment. 2. Near-complete non-retracted tears of the left hamstring tendon origin. Electronically Signed   By: Davina Poke D.O.   On: 03/19/2021 15:20   DG Hip Unilat With Pelvis 2-3 Views Left  Result Date: 03/19/2021 CLINICAL DATA:  Status post fall, left hip pain EXAM: DG HIP (WITH OR WITHOUT PELVIS) 2-3V LEFT COMPARISON:  None. FINDINGS: No acute fracture or dislocation. No aggressive osseous lesion. Normal alignment. Lower lumbar spine spondylosis Soft tissue are unremarkable. No radiopaque foreign body or soft tissue emphysema. IMPRESSION: No acute osseous injury of the left hip. Electronically Signed   By: Kathreen Devoid M.D.   On: 03/19/2021 12:47    EKG: Independently reviewed. none  Assessment/Plan Principal Problem:   Avulsion fracture of ischial tuberosity (HCC) Active Problems:   Fall   Chronic pain   Hypothyroidism   Essential hypertension   Overactive bladder   Atrial fibrillation (HCC)   Presence of Watchman left atrial appendage closure device   Diabetes (HCC)   Elevated serum creatinine    * Avulsion fracture of ischial tuberosity (HCC)- (present on admission) Fall with acute avulsion fx of L ischial tuberosity with 4x1.2 cm minimally displaced fx fragment Also with non complete non retracted tears of L hamstring tendon origin Orthopedic recommending pain control and WBAT, outpatient follow up Will admit for pain management, therapy, and placement if needed Scheduled APAP, prn norco, scheduled voltaren, lidocaine patch  Fall- (present on admission) At baseline, wheelchair bound, can transfer [name] when transferring to toilet PT, follow  Chronic pain Gabapentin, cymbalta   Hypothyroidism Synthroid   Essential hypertension Continue metoprolol Holding spironolactone and torsemide for now  Overactive bladder myrbetriq   Presence of Watchman left atrial  appendage closure device- (present on admission) 2019, s/p watchman Continue beta blocker  Atrial fibrillation ([place])- (present on admission) S/p watchman, beta blocker  Elevated serum creatinine Unclear baseline, follow labs  Diabetes ([place]) No longer taking any meds, follow a1c    DVT prophylaxis: lovenox  Code Status:   DNR  Family Communication:  Son   Disposition Plan:   Patient is from:  home  Anticipated DC to:  Home vs SNF  Anticipated DC date:  2-3 days  Anticipated DC barriers: Pain control, therapy  Consults called:  ortho  Admission status:  obs   Severity of Illness: The appropriate patient status for this patient is OBSERVATION. Observation status is judged  to be reasonable and necessary in order to provide the required intensity of service to ensure the patient's safety. The patient's presenting symptoms, physical exam findings, and initial radiographic and laboratory data in the context of their medical condition is felt to place them at decreased risk for further clinical deterioration. Furthermore, it is anticipated that the patient will be medically stable for discharge from the hospital within 2 midnights of admission.     Fayrene Helper MD [facility]  How to contact the [facility] Attending or Consulting provider [place] or covering provider during after hours [place], for this patient?   Check the care team in [facility] and look for Shyra Emile) attending/consulting [facility] provider listed and b) the [facility] team listed Log into [email or web address] and use [place]'s universal password to access. If you do not have the password, please contact the hospital operator. Locate the [facility] provider you are looking for under [facility] and page to Desere Gwin number that you can be directly reached. If you still have difficulty reaching the provider, please page the [facility] (Director on Call) for the Hospitalists listed on amion for assistance.  03/19/2021, 8:04 PM

## 2021-03-19 NOTE — Plan of Care (Signed)
°  Problem: Education: Goal: Individualized Educational Video(s) Outcome: Progressing   Problem: Pain Managment: Goal: General experience of comfort will improve Outcome: Not Progressing   Problem: Skin Integrity: Goal: Risk for impaired skin integrity will decrease Outcome: Not Progressing

## 2021-03-19 NOTE — Assessment & Plan Note (Addendum)
Gabapentin, cymbalta °

## 2021-03-19 NOTE — Assessment & Plan Note (Signed)
S/p watchman, beta blocker

## 2021-03-19 NOTE — ED Provider Notes (Signed)
I have consulted on-call orthopedist, Dr. Jena Gauss who agrees with hospital admission, likely a nonoperative approach.  He will review the films and place his recommendation.  Pt can eat and drink at this time.    BP (!) 155/105    Pulse 96    Temp 97.8 F (36.6 C) (Oral)    Resp 18    SpO2 100%   Results for orders placed or performed during the hospital encounter of 12/30/20  CBC with Differential  Result Value Ref Range   WBC 14.7 (H) 4.0 - 10.5 K/uL   RBC 4.82 3.87 - 5.11 MIL/uL   Hemoglobin 14.5 12.0 - 15.0 g/dL   HCT 47.4 25.9 - 56.3 %   MCV 95.0 80.0 - 100.0 fL   MCH 30.1 26.0 - 34.0 pg   MCHC 31.7 30.0 - 36.0 g/dL   RDW 87.5 64.3 - 32.9 %   Platelets 204 150 - 400 K/uL   nRBC 0.0 0.0 - 0.2 %   Neutrophils Relative % 65 %   Neutro Abs 9.6 (H) 1.7 - 7.7 K/uL   Lymphocytes Relative 20 %   Lymphs Abs 2.9 0.7 - 4.0 K/uL   Monocytes Relative 14 %   Monocytes Absolute 2.0 (H) 0.1 - 1.0 K/uL   Eosinophils Relative 0 %   Eosinophils Absolute 0.0 0.0 - 0.5 K/uL   Basophils Relative 0 %   Basophils Absolute 0.1 0.0 - 0.1 K/uL   Immature Granulocytes 1 %   Abs Immature Granulocytes 0.09 (H) 0.00 - 0.07 K/uL  Comprehensive metabolic panel  Result Value Ref Range   Sodium 138 135 - 145 mmol/L   Potassium 5.2 (H) 3.5 - 5.1 mmol/L   Chloride 105 98 - 111 mmol/L   CO2 17 (L) 22 - 32 mmol/L   Glucose, Bld 157 (H) 70 - 99 mg/dL   BUN 33 (H) 8 - 23 mg/dL   Creatinine, Ser 5.18 (H) 0.44 - 1.00 mg/dL   Calcium 9.2 8.9 - 84.1 mg/dL   Total Protein 7.8 6.5 - 8.1 g/dL   Albumin 3.6 3.5 - 5.0 g/dL   AST 16 15 - 41 U/L   ALT 12 0 - 44 U/L   Alkaline Phosphatase 76 38 - 126 U/L   Total Bilirubin 1.8 (H) 0.3 - 1.2 mg/dL   GFR, Estimated 25 (L) >60 mL/min   Anion gap 16 (H) 5 - 15  Lipase, blood  Result Value Ref Range   Lipase 43 11 - 51 U/L  Acetaminophen level  Result Value Ref Range   Acetaminophen (Tylenol), Serum 17 10 - 30 ug/mL   MR HIP LEFT WO CONTRAST  Result Date:  03/19/2021 CLINICAL DATA:  Left hip pain.  Fall 5 days ago EXAM: MR OF THE LEFT HIP WITHOUT CONTRAST TECHNIQUE: Multiplanar, multisequence MR imaging was performed. No intravenous contrast was administered. COMPARISON:  X-ray 03/19/2021 FINDINGS: Bones: Acute avulsion fracture of the left ischial tuberosity with an approximately 4.0 x 1.2 cm minimally displaced fracture fragment (series 8, image 15). Marked bone marrow edema within the left ischium. Near-complete non-retracted tears of the left hamstring tendon origin. No additional fracture. No dislocation. No femoral head avascular necrosis. No pelvic diastasis. Degenerative disc disease of the visualized lumbar spine. No marrow replacing bone lesion. Articular cartilage and labrum Articular cartilage:  Mild chondral thinning. Labrum:  Grossly intact on non-arthrographic imaging. Joint or bursal effusion Joint effusion:  None. Bursae: No abnormal bursal fluid collection. Muscles and tendons Muscles and tendons: Near  complete left hamstring avulsion. The gluteal, iliopsoas, rectus femoris, and adductor tendons appear intact. Generalized muscle atrophy. Fatty infiltration of the bilateral gluteal musculature. Other findings Miscellaneous: Small ill-defined hematoma at the fracture site. No inguinal lymphadenopathy. Scattered diverticular changes within the visualized sigmoid colon. IMPRESSION: 1. Acute avulsion fracture of the left ischial tuberosity with an approximately 4.0 x 1.2 cm minimally displaced fracture fragment. 2. Near-complete non-retracted tears of the left hamstring tendon origin. Electronically Signed   By: Duanne Guess D.O.   On: 03/19/2021 15:20   DG Hip Unilat With Pelvis 2-3 Views Left  Result Date: 03/19/2021 CLINICAL DATA:  Status post fall, left hip pain EXAM: DG HIP (WITH OR WITHOUT PELVIS) 2-3V LEFT COMPARISON:  None. FINDINGS: No acute fracture or dislocation. No aggressive osseous lesion. Normal alignment. Lower lumbar spine  spondylosis Soft tissue are unremarkable. No radiopaque foreign body or soft tissue emphysema. IMPRESSION: No acute osseous injury of the left hip. Electronically Signed   By: Elige Ko M.D.   On: 03/19/2021 12:47      Fayrene Helper, PA-C 03/19/21 1632    Vanetta Mulders, MD 03/22/21 [phone]

## 2021-03-19 NOTE — Assessment & Plan Note (Signed)
At baseline, wheelchair bound, can transfer [place] when transferring to toilet PT, follow

## 2021-03-19 NOTE — Assessment & Plan Note (Addendum)
Continue metoprolol Resume spironolactone and torsemide - follow volume status/lytes outaptient

## 2021-03-19 NOTE — Progress Notes (Unsigned)
° °  This pt is active with [facility]. A home based Palliative Care program provided by [facility] [place].  Pt does have DNR in her home unsure if it accompanied her to ED today. The pt's son Ulice Dash, notified us that pt was being taken to hospital for hip pain from a fall several days ago which has continued to have worsening pain.   Please reach out if you have any questions or concerns. We will follow while in hospital and assist with any d/c needs.   Webb Silversmith RN [phone]

## 2021-03-19 NOTE — Assessment & Plan Note (Signed)
Synthroid 

## 2021-03-19 NOTE — Progress Notes (Signed)
Ortho Trauma Note  Reviewed imaging. Patient has avulsion fracture of ischial tuberosity. This is a stable fracture and does not require operative intervention. Pain control and WBAT to left leg. Patient may follow up on an outpatient basis. If pain unable to be controlled can be admitted to hospitalist service.  Roby Lofts, MD [facility] [phone] (office) [email or web address]

## 2021-03-19 NOTE — ED Notes (Signed)
Patient transported to MRI 

## 2021-03-19 NOTE — Assessment & Plan Note (Addendum)
Baseline appears to be around 1.29 follow

## 2021-03-19 NOTE — Assessment & Plan Note (Addendum)
Fall with acute avulsion fx of L ischial tuberosity with 4x1.2 cm minimally displaced fx fragment Also with non complete non retracted tears of L hamstring tendon origin Orthopedic recommending pain control and WBAT, outpatient follow up - can use knee immobilizer when OOB if that helps Will admit for pain management, therapy, and placement if needed Scheduled APAP, prn norco, scheduled voltaren, lidocaine patch Therapy recommending SNF - will discharge to SNF

## 2021-03-19 NOTE — ED Provider Notes (Addendum)
[facility] DEPT Provider Note   CSN: [id number] Arrival date & time: 03/19/21  1138     History  Chief Complaint  Patient presents with   Hip Pain    Fall last week     Kim Gonzalez is a 88 y.o. female with medical history significant for A. fib, CKD, diabetes, hypothyroid.  Patient states that last Thursday she was in her usual state of health when she attempted to use the bathroom.  Patient reports that she got to the restroom in her rolling wheelchair, got herself out of the chair and was using the rail to walk to the bathroom.  Patient states that she successfully used the bathroom and then when she was walking back to her wheelchair utilizing handrail her grip slipped and she fell down onto her left side.  Patient denies hitting her head, loss of consciousness, being on blood thinners.  The patient states that her son who she lives with was able to come and pick her up off the ground, she was on the ground for 2 minutes.  EMS came out to the house, evaluated the patient and she refused transport at this time.  Ever since then, patient has had continued pain.  Patient is able to assist in transfer from wheelchair to commode at baseline, but states that ever since fall she has been full assist and is unable to bear weight on left foot.  Patient's family, who she lives with, felt the patient needed to be brought in for evaluation today.  Patient endorses left hip pain.  Patient denies numbness, tingling, loss of consciousness, headache, dizziness.   Hip Pain Pertinent negatives include no headaches.      Home Medications Prior to Admission medications   Medication Sig Start Date End Date Taking? Authorizing Provider  buPROPion (WELLBUTRIN XL) 300 MG 24 hr tablet Take 1 tablet by mouth once daily in the morning. 10/17/20     cephALEXin (KEFLEX) 500 MG capsule Take 1 capsule by mouth 2 times a day for 7 days 12/30/20     doxycycline (VIBRA-TABS) 100 MG  tablet Take 1 tablet by mouth every 12 hours for 7 days. 02/23/21     DULoxetine (CYMBALTA) 60 MG capsule Take 1 capsule by mouth once daily. 10/17/20     gabapentin (NEURONTIN) 300 MG capsule Take 1 capsule by mouth once daily at bedtime. 10/17/20     levofloxacin (LEVAQUIN) 500 MG tablet Take 1/2 tablet (250 mg total) by mouth daily for 5 days. 01/01/21     levothyroxine (SYNTHROID) 88 MCG tablet Take 1 tablet by mouth in the morning on an empty stomach 10/17/20     metFORMIN (GLUCOPHAGE-XR) 500 MG 24 hr tablet Take 1 tablet by mouth with evening meal 10/17/20     metoprolol succinate (TOPROL-XL) 25 MG 24 hr tablet Take 1 tablet (25 mg total) by mouth daily. 10/17/20     mirabegron ER (MYRBETRIQ) 50 MG TB24 tablet Take 1 tablet (50 mg total) by mouth daily. 10/17/20     potassium chloride (KLOR-CON) 10 MEQ tablet Take 1 tablet by mouth once daily with food 10/21/20     Probiotic Product (ALIGN) 4 MG CAPS Take one capsule by mouth daily. 08/13/20     rosuvastatin (CRESTOR) 20 MG tablet Take 1 tablet (20 mg total) by mouth daily. 10/17/20     Semaglutide (RYBELSUS) 7 MG TABS Take 1 tablet by mouth at least 30 minutes before first food, beverage or other oral medicine  of the day once daily.Marland Kitchen start one month after taking 3mg  30 day(s) 10/22/20     spironolactone (ALDACTONE) 25 MG tablet Take 1 tablet (25 mg total) by mouth daily. 10/17/20     torsemide (DEMADEX) 10 MG tablet Take 1 tablet (10 mg total) by mouth daily. 10/17/20         Allergies    Amoxicillin, Aspirin, Ciprofibrate, Dilantin [phenytoin], Erythromycin, Fentanyl, Ibuprofen, Lovastatin, Macrodantin [nitrofurantoin], Oxycodone, Oxycodone-acetaminophen, and Sulfamethoxazole-trimethoprim    Review of Systems   Review of Systems  Musculoskeletal:  Positive for arthralgias (Left hip).  Neurological:  Negative for dizziness, syncope, weakness, light-headedness, numbness and headaches.  All other systems reviewed and are negative.  Physical  Exam Updated Vital Signs BP (!) 155/105    Pulse 96    Temp 97.8 F (36.6 C) (Oral)    Resp 18    SpO2 100%  Physical Exam Vitals and nursing note reviewed.  Constitutional:      General: She is not in acute distress.    Appearance: She is obese. She is not ill-appearing, toxic-appearing or diaphoretic.  HENT:     Head: Normocephalic and atraumatic.     Nose: Nose normal.     Mouth/Throat:     Mouth: Mucous membranes are moist.  Eyes:     Extraocular Movements: Extraocular movements intact.     Pupils: Pupils are equal, round, and reactive to light.  Cardiovascular:     Rate and Rhythm: Regular rhythm. Tachycardia present.  Pulmonary:     Effort: Pulmonary effort is normal.     Breath sounds: Normal breath sounds.  Abdominal:     General: Abdomen is flat. There is no distension.     Palpations: Abdomen is soft. There is no mass.     Tenderness: There is no abdominal tenderness. There is no guarding.     Hernia: No hernia is present.  Musculoskeletal:     Cervical back: Normal range of motion. No tenderness.     Right hip: Normal.     Left hip: Tenderness present. No deformity or lacerations. Normal range of motion.     Comments: Patient has tenderness to palpation of left hip bone.  There is noticeable external rotation of her left foot, no shortening is noted.  Skin:    General: Skin is warm and dry.     Capillary Refill: Capillary refill takes less than 2 seconds.  Neurological:     General: No focal deficit present.     Mental Status: She is alert and oriented to person, place, and time.    ED Results / Procedures / Treatments   Labs (all labs ordered are listed, but only abnormal results are displayed) Labs Reviewed - No data to display  EKG None  Radiology MR HIP LEFT WO CONTRAST  Result Date: 03/19/2021 CLINICAL DATA:  Left hip pain.  Fall 5 days ago EXAM: MR OF THE LEFT HIP WITHOUT CONTRAST TECHNIQUE: Multiplanar, multisequence MR imaging was performed. No  intravenous contrast was administered. COMPARISON:  X-ray 03/19/2021 FINDINGS: Bones: Acute avulsion fracture of the left ischial tuberosity with an approximately 4.0 x 1.2 cm minimally displaced fracture fragment (series 8, image 15). Marked bone marrow edema within the left ischium. Near-complete non-retracted tears of the left hamstring tendon origin. No additional fracture. No dislocation. No femoral head avascular necrosis. No pelvic diastasis. Degenerative disc disease of the visualized lumbar spine. No marrow replacing bone lesion. Articular cartilage and labrum Articular cartilage:  Mild chondral thinning. Labrum:  Grossly intact on non-arthrographic imaging. Joint or bursal effusion Joint effusion:  None. Bursae: No abnormal bursal fluid collection. Muscles and tendons Muscles and tendons: Near complete left hamstring avulsion. The gluteal, iliopsoas, rectus femoris, and adductor tendons appear intact. Generalized muscle atrophy. Fatty infiltration of the bilateral gluteal musculature. Other findings Miscellaneous: Small ill-defined hematoma at the fracture site. No inguinal lymphadenopathy. Scattered diverticular changes within the visualized sigmoid colon. IMPRESSION: 1. Acute avulsion fracture of the left ischial tuberosity with an approximately 4.0 x 1.2 cm minimally displaced fracture fragment. 2. Near-complete non-retracted tears of the left hamstring tendon origin. Electronically Signed   By: Davina Poke D.O.   On: 03/19/2021 15:20   DG Hip Unilat With Pelvis 2-3 Views Left  Result Date: 03/19/2021 CLINICAL DATA:  Status post fall, left hip pain EXAM: DG HIP (WITH OR WITHOUT PELVIS) 2-3V LEFT COMPARISON:  None. FINDINGS: No acute fracture or dislocation. No aggressive osseous lesion. Normal alignment. Lower lumbar spine spondylosis Soft tissue are unremarkable. No radiopaque foreign body or soft tissue emphysema. IMPRESSION: No acute osseous injury of the left hip. Electronically Signed    By: Kathreen Devoid M.D.   On: 03/19/2021 12:47    Procedures Procedures    Medications Ordered in ED Medications  acetaminophen (TYLENOL) tablet 1,000 mg (1,000 mg Oral Given 03/19/21 1301)    ED Course/ Medical Decision Making/ A&P                           Medical Decision Making Amount and/or Complexity of Data Reviewed Radiology: ordered.  Risk OTC drugs. Decision regarding hospitalization.   88 year old female presents for evaluation of left hip pain status post fall last Thursday.  On examination, patient afebrile, nonhypoxic, slightly tachycardic at 106 pulse rate.  Patient states that at baseline she is able to use her left foot and help transfer from chair to commode, but after fall patient is unable to bear weight on left foot.  Patient worked up utilizing following labs: X-ray of left hip, MRI left hip  X-ray shows no fracture, however there is external rotation of foot and tenderness to palpation of left hip.  I will further investigate utilizing MRI to evaluate for any fracture not noted on x-ray imaging.  Patient MRI was revealing of an acute avulsion fracture of the left ischial tuberosity with approximately 4.0 x 1.2 cm minimally displaced fracture fragment.  This patient will need to be admitted for repair of hip fracture.  Hospitalist and orthopedic surgery has been consulted.  Spoke with Dr. Florene Glen of [facility] hospitalist who was agreed to admit the patient.  I am still awaiting callback from orthopedic surgery.  At the time of my end of shift, I am still awaiting call back from orthopedic surgery. Patient was signed out to Domenic Moras PA-C for ongoing evaluation and discussion with orthopedic surgery. PA Rona Ravens has been updated with the current plan. Patient stable at time of admission.   Final Clinical Impression(s) / ED Diagnoses Final diagnoses:  Closed fracture of left hip, initial encounter [facility])    Rx / [place] Orders ED Discharge Orders     None           Kim Gonzalez 03/19/21 1604    Kim Rank, MD 03/19/21 2011

## 2021-03-19 NOTE — ED Notes (Signed)
Pt verbalized she has had an MRI before and is not claustrophobic.   Pt is incontinent. Pt cleaned. New gown and brief placed. Pt placed on pure wick.   Jewelery taken off and pt gave to son.

## 2021-03-20 DIAGNOSIS — S32613A Displaced avulsion fracture of unspecified ischium, initial encounter for closed fracture: Secondary | ICD-10-CM | POA: Diagnosis not present

## 2021-03-20 LAB — URINALYSIS, COMPLETE (UACMP) WITH MICROSCOPIC
Bilirubin Urine: NEGATIVE
Glucose, UA: NEGATIVE mg/dL
Hgb urine dipstick: NEGATIVE
Ketones, ur: NEGATIVE mg/dL
Nitrite: NEGATIVE
Protein, ur: NEGATIVE mg/dL
Specific Gravity, Urine: 1.021 (ref 1.005–1.030)
WBC, UA: >50 WBC/hpf — ABNORMAL HIGH (ref 0–5)
pH: 5 (ref 5.0–8.0)

## 2021-03-20 LAB — COMPREHENSIVE METABOLIC PANEL
ALT: 10 U/L (ref 0–44)
AST: 12 U/L — ABNORMAL LOW (ref 15–41)
Albumin: 3.4 g/dL — ABNORMAL LOW (ref 3.5–5.0)
Alkaline Phosphatase: 67 U/L (ref 38–126)
Anion gap: 8 (ref 5–15)
BUN: 33 mg/dL — ABNORMAL HIGH (ref 8–23)
CO2: 25 mmol/L (ref 22–32)
Calcium: 8.9 mg/dL (ref 8.9–10.3)
Chloride: 103 mmol/L (ref 98–111)
Creatinine, Ser: 1.29 mg/dL — ABNORMAL HIGH (ref 0.44–1.00)
GFR, Estimated: 39 mL/min — ABNORMAL LOW (ref >60)
Glucose, Bld: 156 mg/dL — ABNORMAL HIGH (ref 70–99)
Potassium: 4.1 mmol/L (ref 3.5–5.1)
Sodium: 136 mmol/L (ref 135–145)
Total Bilirubin: 0.8 mg/dL (ref 0.3–1.2)
Total Protein: 6.7 g/dL (ref 6.5–8.1)

## 2021-03-20 LAB — CBC
HCT: 39.8 % (ref 36.0–46.0)
Hemoglobin: 12.7 g/dL (ref 12.0–15.0)
MCH: 31 pg (ref 26.0–34.0)
MCHC: 31.9 g/dL (ref 30.0–36.0)
MCV: 97.1 fL (ref 80.0–100.0)
Platelets: 205 10*3/uL (ref 150–400)
RBC: 4.1 MIL/uL (ref 3.87–5.11)
RDW: 13.3 % (ref 11.5–15.5)
WBC: 8.1 10*3/uL (ref 4.0–10.5)
nRBC: 0 % (ref 0.0–0.2)

## 2021-03-20 NOTE — TOC Initial Note (Signed)
Transition of Care [facility]) - Initial/Assessment Note   Patient Details  Name: Kim Gonzalez MRN: [id number] Date of Birth: 04-23-1928  [facility] CM/SW Contact:    Sherie Don, LCSW Phone Number: 03/20/2021, 1:01 PM  Clinical Narrative: PT evaluation recommended SNF. CSW spoke with patient who is agreeable to rehab. Patient is vaccinated x3 for COVID.  FL2 done; PASRR received. Initial referral faxed out. TOC awaiting bed offers.  Expected Discharge Plan: Skilled Nursing Facility Barriers to Discharge: SNF Pending bed offer  Patient Goals and CMS Choice Patient states their goals for this hospitalization and ongoing recovery are:: Go to rehab CMS Medicare.gov Compare Post Acute Care list provided to:: Patient Choice offered to / list presented to : Patient  Expected Discharge Plan and Services Expected Discharge Plan: [place] In-house Referral: Clinical Social Work Post Acute Care Choice: [place] Living arrangements for the past 2 months: [place]            DME Arranged: N/A DME Agency: NA  Prior Living Arrangements/Services Living arrangements for the past 2 months: Single Family Home Patient language and need for interpreter reviewed:: Yes Do you feel safe going back to the place where you live?: Yes      Need for Family Participation in Patient Care: Yes (Comment) Care giver support system in place?: Yes (comment) Criminal Activity/Legal Involvement Pertinent to Current Situation/Hospitalization: No - Comment as needed  Activities of Daily Living Home Assistive Devices/Equipment: Walker (specify type) ADL Screening (condition at time of admission) Patient's cognitive ability adequate to safely complete daily activities?: Yes Is the patient deaf or have difficulty hearing?: Yes Does the patient have difficulty seeing, even when wearing glasses/contacts?: No Does the patient have difficulty concentrating,  remembering, or making decisions?: No Patient able to express need for assistance with ADLs?: Yes Does the patient have difficulty dressing or bathing?: Yes Independently performs ADLs?: No Communication: Independent Dressing (OT): Needs assistance Is this a change from baseline?: Pre-admission baseline Grooming: Needs assistance Is this a change from baseline?: Pre-admission baseline Feeding: Independent Bathing: Needs assistance Is this a change from baseline?: Pre-admission baseline Toileting: Dependent Is this a change from baseline?: Change from baseline, expected to last <3 days In/Out Bed: Dependent Walks in Home: Needs assistance Is this a change from baseline?: Pre-admission baseline Does the patient have difficulty walking or climbing stairs?: Yes Weakness of Legs: Left Weakness of Arms/Hands: None  Permission Sought/Granted Permission sought to share information with : Facility Art therapist granted to share information with : Yes, Verbal Permission Granted Permission granted to share info w AGENCY: SNFs  Emotional Assessment Attitude/Demeanor/Rapport: Engaged Affect (typically observed): Appropriate Orientation: : Oriented to Self, Oriented to Place, Oriented to  Time, Oriented to Situation Alcohol / Substance Use: Not Applicable Psych Involvement: No (comment)  Admission diagnosis:  Fall [W19.XXXA] Avulsion fracture of ischial tuberosity ([place]) [S32.613A] Closed fracture of left hip, initial encounter [facility]) [S72.002A] Patient Active Problem List   Diagnosis Date Noted   Avulsion fracture of ischial tuberosity ([place]) 03/19/2021   Fall 03/19/2021   Chronic pain 03/19/2021   Hypothyroidism 03/19/2021   Overactive bladder 03/19/2021   Diabetes ([place]) 03/19/2021   Elevated serum creatinine 03/19/2021   Atrial fibrillation ([place]) 12/05/2020   Presence of Watchman left atrial appendage closure device 12/05/2020   Essential hypertension 12/05/2020    Obesity (BMI 30-39.9) 12/05/2020   PCP:  [name], PA Pharmacy:   [facility]  [place] [address] Phone: [phone] Fax: [phone]  Readmission Risk Interventions No flowsheet data found.

## 2021-03-20 NOTE — Progress Notes (Signed)
Orthopedic Tech Progress Note Patient Details:  Kim Gonzalez 10-27-1928 [id number]  Ortho Devices Type of Ortho Device: Knee Immobilizer Ortho Device/Splint Location: Left Knee Ortho Device/Splint Interventions: Adjustment   Post Interventions Instructions Provided: Adjustment of device  Jesselee Poth E Mykira Hofmeister 03/20/2021, 2:37 PM

## 2021-03-20 NOTE — [place] FL2 (Signed)
[place] MEDICAID FL2 LEVEL OF CARE SCREENING TOOL     IDENTIFICATION  Patient Name: Kim Gonzalez Birthdate: 12-08-1928 Sex: female Admission Date (Current Location): 03/19/2021  [facility] and [place] Number:  Producer, television/film/video and Address:  [facility],  [address] [place], [place] [address]      Provider Number: [id number]  Attending Physician Name and Address:  Zigmund Daniel., *  Relative Name and Phone Number:  Chastity Noland (son) Ph: [phone]    Current Level of Care: Hospital Recommended Level of Care: Skilled Nursing Facility Prior Approval Number:    Date Approved/Denied:   PASRR Number: [id number]  Discharge Plan: SNF    Current Diagnoses: Patient Active Problem List   Diagnosis Date Noted   Avulsion fracture of ischial tuberosity (HCC) 03/19/2021   Fall 03/19/2021   Chronic pain 03/19/2021   Hypothyroidism 03/19/2021   Overactive bladder 03/19/2021   Diabetes (HCC) 03/19/2021   Elevated serum creatinine 03/19/2021   Atrial fibrillation (HCC) 12/05/2020   Presence of Watchman left atrial appendage closure device 12/05/2020   Essential hypertension 12/05/2020   Obesity (BMI 30-39.9) 12/05/2020    Orientation RESPIRATION BLADDER Height & Weight     Self, Time, Situation, Place  Normal Incontinent Weight:   Height:     BEHAVIORAL SYMPTOMS/MOOD NEUROLOGICAL BOWEL NUTRITION STATUS   (N/A)  (N/A) Continent Diet (Regular diet)  AMBULATORY STATUS COMMUNICATION OF NEEDS Skin   Extensive Assist Verbally Other (Comment) (Wound on back of heel)                       Personal Care Assistance Level of Assistance  Bathing, Feeding, Dressing Bathing Assistance: Maximum assistance Feeding assistance: Limited assistance Dressing Assistance: Maximum assistance     Functional Limitations Info  Sight, Hearing, Speech Sight Info: Impaired Hearing Info: Impaired Speech Info: Adequate    SPECIAL CARE FACTORS FREQUENCY  PT (By  licensed PT), OT (By licensed OT)     PT Frequency: 5x's/week OT Frequency: 5x's/week            Contractures Contractures Info: Not present    Additional Factors Info  Code Status, Allergies Code Status Info: DNR Allergies Info: Amoxicillin, Aspirin, Ciprofibrate, Dilantin (Phenytoin), Doxycycline, Erythromycin, Fentanyl, Ibuprofen, Lovastatin, Macrodantin(Nitrofurantoin), Oxycodone, Oxycodone-acetaminophen, Sulfamethoxazole-trimethoprim           Current Medications (03/20/2021):  This is the current hospital active medication list Current Facility-Administered Medications  Medication Dose Route Frequency Provider Last Rate Last Admin   acetaminophen (TYLENOL) tablet 500 mg  500 mg Oral Q6H Zigmund Daniel., MD   500 mg at 03/20/21 1107   Followed by   Melene Muller ON 03/23/2021] acetaminophen (TYLENOL) tablet 650 mg  650 mg Oral Q6H PRN Zigmund Daniel., MD       buPROPion (WELLBUTRIN XL) 24 hr tablet 300 mg  300 mg Oral Daily Zigmund Daniel., MD   300 mg at 03/20/21 0354   diclofenac Sodium (VOLTAREN) 1 % topical gel 4 g  4 g Topical QID Zigmund Daniel., MD   4 g at 03/20/21 6568   DULoxetine (CYMBALTA) DR capsule 60 mg  60 mg Oral Daily Zigmund Daniel., MD   60 mg at 03/20/21 0925   enoxaparin (LOVENOX) injection 40 mg  40 mg Subcutaneous Q24H Zigmund Daniel., MD   40 mg at 03/19/21 1824   gabapentin (NEURONTIN) capsule 300 mg  300 mg Oral QHS Lowell Guitar, A  Charlyn Minerva., MD   300 mg at 03/19/21 2135   HYDROcodone-acetaminophen (NORCO/VICODIN) 5-325 MG per tablet 1 tablet  1 tablet Oral Q6H PRN Zigmund Daniel., MD   1 tablet at 03/20/21 1107   levothyroxine (SYNTHROID) tablet 88 mcg  88 mcg Oral QAC breakfast Zigmund Daniel., MD   88 mcg at 03/20/21 0535   lidocaine (LIDODERM) 5 % 1 patch  1 patch Transdermal Q24H Zigmund Daniel., MD   1 patch at 03/19/21 2135   metoprolol succinate (TOPROL-XL) 24 hr tablet 25 mg  25 mg Oral  Daily Zigmund Daniel., MD   25 mg at 03/20/21 0925   mirabegron ER (MYRBETRIQ) tablet 50 mg  50 mg Oral Daily Zigmund Daniel., MD   50 mg at 03/20/21 8182   polyethylene glycol (MIRALAX / GLYCOLAX) packet 17 g  17 g Oral Daily PRN Zigmund Daniel., MD       rosuvastatin (CRESTOR) tablet 20 mg  20 mg Oral Daily Zigmund Daniel., MD   20 mg at 03/20/21 0930     Discharge Medications: Please see discharge summary for a list of discharge medications.  Relevant Imaging Results:  Relevant Lab Results:   Additional Information SSN: [id number]  Ewing Schlein, LCSW

## 2021-03-20 NOTE — Progress Notes (Signed)
PROGRESS NOTE    Kim Gonzalez  ZOX:[id number] DOB: 12-Apr-1928 DOA: 03/19/2021 PCP: Wilfrid LundBecker, Anna G, PA  Chief Complaint  Patient presents with   Hip Pain    Fall last week     Brief Narrative:  88 yo with hx afib s/p watchman, CKD, diabetes, hypothyroidism, and multiple other medical problems who presents 7 days after recurrent falls.  She's found to have left ischial tuberosity avulsion fracture and near complete non retracted tears of the L hamstring tendon origin.  Non operative per ortho.  Plan for SNF.    Assessment & Plan:   Principal Problem:   Avulsion fracture of ischial tuberosity (HCC) Active Problems:   Fall   Chronic pain   Hypothyroidism   Essential hypertension   Overactive bladder   Atrial fibrillation (HCC)   Presence of Watchman left atrial appendage closure device   Diabetes (HCC)   Elevated serum creatinine   * Avulsion fracture of ischial tuberosity (HCC)- (present on admission) Fall with acute avulsion fx of L ischial tuberosity with 4x1.2 cm minimally displaced fx fragment Also with non complete non retracted tears of L hamstring tendon origin Orthopedic recommending pain control and WBAT, outpatient follow up Will admit for pain management, therapy, and placement if needed Scheduled APAP, prn norco, scheduled voltaren, lidocaine patch Therapy recommending SNF - will follow with SW  Fall- (present on admission) At baseline, wheelchair bound, can transfer [name] when transferring to toilet PT, follow  Chronic pain Gabapentin, cymbalta   Hypothyroidism Synthroid   Essential hypertension Continue metoprolol Holding spironolactone and torsemide for now  Overactive bladder myrbetriq   Presence of Watchman left atrial appendage closure device- (present on admission) 2019, s/p watchman Continue beta blocker  Atrial fibrillation (HCC)- (present on admission) S/p watchman, beta blocker  Elevated serum creatinine Baseline appears to be  around 1.29 follow  Diabetes (HCC) No longer taking any meds, follow a1c pending   DVT prophylaxis: lovenox Code Status: DNR Family Communication: none Disposition:   Status is: Observation  The patient will require care spanning > 2 midnights and should be moved to inpatient because: unsafe discharge, needs rehab   Consultants:  orthopedics  Procedures:  none  Antimicrobials:  Anti-infectives (From admission, onward)    None       Subjective: No new complaints  Objective: Vitals:   03/19/21 1726 03/19/21 2153 03/20/21 0126 03/20/21 0549  BP: (!) 143/77 139/72 (!) 151/77 (!) 151/86  Pulse: 92 (!) 103 96 97  Resp: 20 16 18 16   Temp: 98.4 F (36.9 C) 98.2 F (36.8 C) 97.6 F (36.4 C) 97.9 F (36.6 C)  TempSrc: Oral Oral Oral Oral  SpO2: 99% 96% 95% 96%    Intake/Output Summary (Last 24 hours) at 03/20/2021 1323 Last data filed at 03/20/2021 0600 Gross per 24 hour  Intake 360 ml  Output 0 ml  Net 360 ml   There were no vitals filed for this visit.  Examination:  General exam: Appears calm and comfortable  Respiratory system: Clear to auscultation. Respiratory effort normal. Cardiovascular system: RRR Gastrointestinal system: Abdomen is nondistended, soft and nontender.  Central nervous system: Alert and oriented. No focal neurological deficits. Extremities: no LEE Skin: No rashes, lesions or ulcers Psychiatry: Judgement and insight appear normal. Mood & affect appropriate.     Data Reviewed: I have personally reviewed following labs and imaging studies  CBC: Recent Labs  Lab 03/19/21 1820 03/20/21 0327  WBC 8.0 8.1  HGB 13.3 12.7  HCT 41.3 39.8  MCV 96.0 97.1  PLT 212 205    Basic Metabolic Panel: Recent Labs  Lab 03/19/21 1820 03/20/21 0327  NA 135 136  K 4.3 4.1  CL 101 103  CO2 25 25  GLUCOSE 125* 156*  BUN 28* 33*  CREATININE 1.28*   1.26* 1.29*  CALCIUM 9.3 8.9    GFR: CrCl cannot be calculated (Unknown ideal  weight.).  Liver Function Tests: Recent Labs  Lab 03/20/21 0327  AST 12*  ALT 10  ALKPHOS 67  BILITOT 0.8  PROT 6.7  ALBUMIN 3.4*    CBG: No results for input(s): GLUCAP in the last 168 hours.   Recent Results (from the past 240 hour(s))  Resp Panel by RT-PCR (Flu Xayla Puzio&B, Covid) Nasopharyngeal Swab     Status: None   Collection Time: 03/19/21  9:40 PM   Specimen: Nasopharyngeal Swab; Nasopharyngeal(NP) swabs in vial transport medium  Result Value Ref Range Status   SARS Coronavirus 2 by RT PCR NEGATIVE NEGATIVE Final    Comment: (NOTE) SARS-CoV-2 target nucleic acids are NOT DETECTED.  The SARS-CoV-2 RNA is generally detectable in upper respiratory specimens during the acute phase of infection. The lowest concentration of SARS-CoV-2 viral copies this assay can detect is 138 copies/mL. [name] negative result does not preclude SARS-Cov-2 infection and should not be used as the sole basis for treatment or other patient management decisions. [name] negative result may occur with  improper specimen collection/handling, submission of specimen other than nasopharyngeal swab, presence of viral mutation(s) within the areas targeted by this assay, and inadequate number of viral copies(<138 copies/mL). [name] negative result must be combined with clinical observations, patient history, and epidemiological information. The expected result is Negative.  Fact Sheet for Patients:  BloggerCourse.com  Fact Sheet for Healthcare Providers:  SeriousBroker.it  This test is no t yet approved or cleared by the [place] FDA and  has been authorized for detection and/or diagnosis of SARS-CoV-2 by FDA under an Emergency Use Authorization (EUA). This EUA will remain  in effect (meaning this test can be used) for the duration of the COVID-19 declaration under Section 564(b)(1) of the Act, 21 U.S.C.section 360bbb-3(b)(1), unless the authorization is  terminated  or revoked sooner.       Influenza Gisel Vipond by PCR NEGATIVE NEGATIVE Final   Influenza B by PCR NEGATIVE NEGATIVE Final    Comment: (NOTE) The Xpert Xpress SARS-CoV-2/FLU/RSV plus assay is intended as an aid in the diagnosis of influenza from Nasopharyngeal swab specimens and should not be used as Katye Valek sole basis for treatment. Nasal washings and aspirates are unacceptable for Xpert Xpress SARS-CoV-2/FLU/RSV testing.  Fact Sheet for Patients: BloggerCourse.com  Fact Sheet for Healthcare Providers: SeriousBroker.it  This test is not yet approved or cleared by the [place] FDA and has been authorized for detection and/or diagnosis of SARS-CoV-2 by FDA under an Emergency Use Authorization (EUA). This EUA will remain in effect (meaning this test can be used) for the duration of the COVID-19 declaration under Section 564(b)(1) of the Act, 21 U.S.C. section 360bbb-3(b)(1), unless the authorization is terminated or revoked.  Performed at [facility], [address] [place], [place] [address]          Radiology Studies: MR HIP LEFT WO CONTRAST  Result Date: 03/19/2021 CLINICAL DATA:  Left hip pain.  Fall 5 days ago EXAM: MR OF THE LEFT HIP WITHOUT CONTRAST TECHNIQUE: Multiplanar, multisequence MR imaging was performed. No intravenous contrast was administered. COMPARISON:  X-ray 03/19/2021 FINDINGS:  Bones: Acute avulsion fracture of the left ischial tuberosity with an approximately 4.0 x 1.2 cm minimally displaced fracture fragment (series 8, image 15). Marked bone marrow edema within the left ischium. Near-complete non-retracted tears of the left hamstring tendon origin. No additional fracture. No dislocation. No femoral head avascular necrosis. No pelvic diastasis. Degenerative disc disease of the visualized lumbar spine. No marrow replacing bone lesion. Articular cartilage and labrum Articular cartilage:   Mild chondral thinning. Labrum:  Grossly intact on non-arthrographic imaging. Joint or bursal effusion Joint effusion:  None. Bursae: No abnormal bursal fluid collection. Muscles and tendons Muscles and tendons: Near complete left hamstring avulsion. The gluteal, iliopsoas, rectus femoris, and adductor tendons appear intact. Generalized muscle atrophy. Fatty infiltration of the bilateral gluteal musculature. Other findings Miscellaneous: Small ill-defined hematoma at the fracture site. No inguinal lymphadenopathy. Scattered diverticular changes within the visualized sigmoid colon. IMPRESSION: 1. Acute avulsion fracture of the left ischial tuberosity with an approximately 4.0 x 1.2 cm minimally displaced fracture fragment. 2. Near-complete non-retracted tears of the left hamstring tendon origin. Electronically Signed   By: Duanne Guess D.O.   On: 03/19/2021 15:20   DG Hip Unilat With Pelvis 2-3 Views Left  Result Date: 03/19/2021 CLINICAL DATA:  Status post fall, left hip pain EXAM: DG HIP (WITH OR WITHOUT PELVIS) 2-3V LEFT COMPARISON:  None. FINDINGS: No acute fracture or dislocation. No aggressive osseous lesion. Normal alignment. Lower lumbar spine spondylosis Soft tissue are unremarkable. No radiopaque foreign body or soft tissue emphysema. IMPRESSION: No acute osseous injury of the left hip. Electronically Signed   By: Elige Ko M.D.   On: 03/19/2021 12:47        Scheduled Meds:  acetaminophen  500 mg Oral Q6H   buPROPion  300 mg Oral Daily   diclofenac Sodium  4 g Topical QID   DULoxetine  60 mg Oral Daily   enoxaparin (LOVENOX) injection  40 mg Subcutaneous Q24H   gabapentin  300 mg Oral QHS   levothyroxine  88 mcg Oral QAC breakfast   lidocaine  1 patch Transdermal Q24H   metoprolol succinate  25 mg Oral Daily   mirabegron ER  50 mg Oral Daily   rosuvastatin  20 mg Oral Daily   Continuous Infusions:   LOS: 0 days    Time spent: over 30 min    Lacretia Nicks, MD [facility]   To contact the attending provider between 7A-7P or the covering provider during after hours 7P-7A, please log into the web site [email or web address] and access using universal [place] password for that web site. If you do not have the password, please call the hospital operator.  03/20/2021, 1:23 PM

## 2021-03-20 NOTE — Evaluation (Signed)
Physical Therapy Evaluation Patient Details Name: Kim Gonzalez MRN: [id number] DOB: 1928-04-26 Today's Date: 03/20/2021  History of Present Illness  Pt is a 88 y.o. female who presents following recurrent falls at home.  Found to have acute avulsion fx of L ischial tuberosity and non complete non retracted tears of L hamstring tendon origin. PMH significant for hx afib s/p watchman, CKD, diabetes, and hypothyroidism.   Clinical Impression  Pt is a 88 y.o. female with above HPI  resulting in the deficits listed below (see PT Problem List). Pt performed supine to sit transfers with MAX A due to increased pain with all mobility. At baseline, pt reports she is able to perform transfers independently to electric w/c and uses stair lift at home. Pt lives with her son and daughter-in-law and has some assistance from caregiver. Recommend SNF upon d/c at this time as pt is currently requiring increased assistance for mobility and unable to progress to transfers today due to increased pain levels. Pt will benefit from continued skilled PT to increase independence and maximize safety with mobility.     Recommendations for follow up therapy are one component of a multi-disciplinary discharge planning process, led by the attending physician.  Recommendations may be updated based on patient status, additional functional criteria and insurance authorization.  Follow Up Recommendations Skilled nursing-short term rehab (<3 hours/day)    Assistance Recommended at Discharge Intermittent Supervision/Assistance (for mobility)  Patient can return home with the following  A lot of help with walking and/or transfers;A little help with bathing/dressing/bathroom;Assistance with cooking/housework;Assist for transportation;Help with stairs or ramp for entrance    Equipment Recommendations None recommended by PT  Recommendations for Other Services       Functional Status Assessment Patient has had a recent decline in  their functional status and demonstrates the ability to make significant improvements in function in a reasonable and predictable amount of time.     Precautions / Restrictions Precautions Precautions: Fall Restrictions Weight Bearing Restrictions: Yes LLE Weight Bearing: Weight bearing as tolerated      Mobility  Bed Mobility Overal bed mobility: Needs Assistance Bed Mobility: Supine to Sit, Sit to Supine, Rolling Rolling: Max assist   Supine to sit: Max assist, HOB elevated Sit to supine: Max assist, HOB elevated   General bed mobility comments: MAX A to roll withuse of bed rails; MAX A for supine to/from sit transfers with cues for sequencing and multiple rest breaks when performing due to L hamstring and history of low back pain. Use of chuck pad to assist with getting hips to midline while seated EOB, Fluctuating between MOD-CGA with seated balance, and R lateral lean noted. CGA when pt using bed rails to assist, increased discomfort and pt grimacing when prompter for weight shift to L side to achieve midline. Pt requesting to return back to supine.    Transfers                   General transfer comment: Pt politely deferring due to increased pain while sitting EOB.    Ambulation/Gait                  Stairs            Wheelchair Mobility    Modified Rankin (Stroke Patients Only)       Balance Overall balance assessment: Needs assistance Sitting-balance support: Single extremity supported Sitting balance-Leahy Scale: Poor  Pertinent Vitals/Pain Pain Assessment Pain Assessment: 0-10 Pain Score: 8  Pain Location: L posterior thigh and low back Pain Descriptors / Indicators: Grimacing, Sore, Aching, Discomfort Pain Intervention(s): Limited activity within patient's tolerance, Monitored during session, Repositioned, Patient requesting pain meds-RN notified    Home Living Family/patient  expects to be discharged to:: Private residence Living Arrangements: Children (son and daughter in [employer]) Available Help at Discharge: Family Type of Home: House Home Access: Ramped entrance       Home Layout: Two level (bedroom on 2nd level, has stair lift) Home Equipment: Tub bench;Hand held shower head;Grab bars - toilet;Grab bars - tub/shower;Wheelchair - Engineer, manufacturing systems (2 wheels);BSC/3in1 (lift chair) Additional Comments: Electric chair primary means for mobility. Reports daughter-in-law woroks at [place] and son sometimes work from home and sometimes has to work outside of home    Prior Function Prior Level of Function : Needs assist;History of Falls (last six months)       Physical Assist : ADLs (physical)   ADLs (physical): Bathing;Dressing (only wears tops and pull up on bottom) Mobility Comments: Been (I) with w/c transfers. Been sleeping in a lift chair for ~3 yrs ADLs Comments: caregiver comes from 10-1pm Mon-Fri     Hand Dominance        Extremity/Trunk Assessment   Upper Extremity Assessment Upper Extremity Assessment: Generalized weakness    Lower Extremity Assessment Lower Extremity Assessment: RLE deficits/detail;LLE deficits/detail RLE Deficits / Details: at least 3/5, DF/PF 5/5 LLE Deficits / Details: at least 3/5, DF/PF 5/5 LLE: Unable to fully assess due to pain    Cervical / Trunk Assessment Cervical / Trunk Assessment: Normal  Communication   Communication: No difficulties  Cognition Arousal/Alertness: Awake/alert Behavior During Therapy: WFL for tasks assessed/performed Overall Cognitive Status: Within Functional Limits for tasks assessed                                          General Comments      Exercises     Assessment/Plan    PT Assessment Patient needs continued PT services  PT Problem List Decreased strength;Decreased range of motion;Decreased activity tolerance;Decreased balance;Decreased  mobility;Pain       PT Treatment Interventions DME instruction;Gait training;Functional mobility training;Therapeutic activities;Therapeutic exercise;Balance training;Patient/family education;Wheelchair mobility training    PT Goals (Current goals can be found in the Care Plan section)  Acute Rehab PT Goals Patient Stated Goal: have less pain PT Goal Formulation: With patient Time For Goal Achievement: 04/03/21 Potential to Achieve Goals: Good    Frequency Min 2X/week     Co-evaluation               AM-PAC PT "6 Clicks" Mobility  Outcome Measure Help needed turning from your back to your side while in a flat bed without using bedrails?: A Lot Help needed moving from lying on your back to sitting on the side of a flat bed without using bedrails?: A Lot Help needed moving to and from a bed to a chair (including a wheelchair)?: Total Help needed standing up from a chair using your arms (e.g., wheelchair or bedside chair)?: Total Help needed to walk in hospital room?: Total Help needed climbing 3-5 steps with a railing? : Total 6 Click Score: 8    End of Session   Activity Tolerance: Patient limited by pain Patient left: in bed;with call bell/phone within reach;with bed alarm set Nurse  Communication: Mobility status;Patient requests pain meds PT Visit Diagnosis: Unsteadiness on feet (R26.81);Muscle weakness (generalized) (M62.81);Pain;Other abnormalities of gait and mobility (R26.89) Pain - Right/Left: Left Pain - part of body: Leg    Time: 1032-1101 PT Time Calculation (min) (ACUTE ONLY): 29 min   Charges:   PT Evaluation $PT Eval Low Complexity: 1 Low PT Treatments $Therapeutic Activity: 8-22 mins        Festus Barren PT, DPT  Acute Rehabilitation Services  Office [phone]  03/20/2021, 11:31 AM

## 2021-03-21 DIAGNOSIS — E1122 Type 2 diabetes mellitus with diabetic chronic kidney disease: Secondary | ICD-10-CM | POA: Diagnosis present

## 2021-03-21 DIAGNOSIS — I480 Paroxysmal atrial fibrillation: Secondary | ICD-10-CM | POA: Diagnosis present

## 2021-03-21 DIAGNOSIS — E785 Hyperlipidemia, unspecified: Secondary | ICD-10-CM | POA: Diagnosis present

## 2021-03-21 DIAGNOSIS — N390 Urinary tract infection, site not specified: Secondary | ICD-10-CM | POA: Insufficient documentation

## 2021-03-21 DIAGNOSIS — N1831 Chronic kidney disease, stage 3a: Secondary | ICD-10-CM | POA: Diagnosis present

## 2021-03-21 DIAGNOSIS — Z993 Dependence on wheelchair: Secondary | ICD-10-CM | POA: Diagnosis not present

## 2021-03-21 DIAGNOSIS — Z7984 Long term (current) use of oral hypoglycemic drugs: Secondary | ICD-10-CM | POA: Diagnosis not present

## 2021-03-21 DIAGNOSIS — Z882 Allergy status to sulfonamides status: Secondary | ICD-10-CM | POA: Diagnosis not present

## 2021-03-21 DIAGNOSIS — Z888 Allergy status to other drugs, medicaments and biological substances status: Secondary | ICD-10-CM | POA: Diagnosis not present

## 2021-03-21 DIAGNOSIS — Z79899 Other long term (current) drug therapy: Secondary | ICD-10-CM | POA: Diagnosis not present

## 2021-03-21 DIAGNOSIS — S32612A Displaced avulsion fracture of left ischium, initial encounter for closed fracture: Secondary | ICD-10-CM | POA: Diagnosis present

## 2021-03-21 DIAGNOSIS — R300 Dysuria: Secondary | ICD-10-CM

## 2021-03-21 DIAGNOSIS — Z20822 Contact with and (suspected) exposure to covid-19: Secondary | ICD-10-CM | POA: Diagnosis present

## 2021-03-21 DIAGNOSIS — E039 Hypothyroidism, unspecified: Secondary | ICD-10-CM | POA: Diagnosis present

## 2021-03-21 DIAGNOSIS — Z881 Allergy status to other antibiotic agents status: Secondary | ICD-10-CM | POA: Diagnosis not present

## 2021-03-21 DIAGNOSIS — B9562 Methicillin resistant Staphylococcus aureus infection as the cause of diseases classified elsewhere: Secondary | ICD-10-CM | POA: Diagnosis present

## 2021-03-21 DIAGNOSIS — Z95818 Presence of other cardiac implants and grafts: Secondary | ICD-10-CM | POA: Diagnosis not present

## 2021-03-21 DIAGNOSIS — W1830XA Fall on same level, unspecified, initial encounter: Secondary | ICD-10-CM | POA: Diagnosis present

## 2021-03-21 DIAGNOSIS — Z885 Allergy status to narcotic agent status: Secondary | ICD-10-CM | POA: Diagnosis not present

## 2021-03-21 DIAGNOSIS — Z7985 Long-term (current) use of injectable non-insulin antidiabetic drugs: Secondary | ICD-10-CM | POA: Diagnosis not present

## 2021-03-21 DIAGNOSIS — B952 Enterococcus as the cause of diseases classified elsewhere: Secondary | ICD-10-CM | POA: Diagnosis present

## 2021-03-21 DIAGNOSIS — M25552 Pain in left hip: Secondary | ICD-10-CM | POA: Diagnosis present

## 2021-03-21 DIAGNOSIS — Z886 Allergy status to analgesic agent status: Secondary | ICD-10-CM | POA: Diagnosis not present

## 2021-03-21 DIAGNOSIS — Z880 Allergy status to penicillin: Secondary | ICD-10-CM | POA: Diagnosis not present

## 2021-03-21 DIAGNOSIS — Y92012 Bathroom of single-family (private) house as the place of occurrence of the external cause: Secondary | ICD-10-CM | POA: Diagnosis not present

## 2021-03-21 DIAGNOSIS — S32613A Displaced avulsion fracture of unspecified ischium, initial encounter for closed fracture: Secondary | ICD-10-CM | POA: Diagnosis not present

## 2021-03-21 DIAGNOSIS — Z79890 Hormone replacement therapy (postmenopausal): Secondary | ICD-10-CM | POA: Diagnosis not present

## 2021-03-21 DIAGNOSIS — R8271 Bacteriuria: Secondary | ICD-10-CM | POA: Diagnosis not present

## 2021-03-21 DIAGNOSIS — N3289 Other specified disorders of bladder: Secondary | ICD-10-CM | POA: Diagnosis not present

## 2021-03-21 DIAGNOSIS — I129 Hypertensive chronic kidney disease with stage 1 through stage 4 chronic kidney disease, or unspecified chronic kidney disease: Secondary | ICD-10-CM | POA: Diagnosis present

## 2021-03-21 DIAGNOSIS — Z66 Do not resuscitate: Secondary | ICD-10-CM | POA: Diagnosis present

## 2021-03-21 LAB — HEMOGLOBIN A1C
Hgb A1c MFr Bld: 6.3 % — ABNORMAL HIGH (ref 4.8–5.6)
Mean Plasma Glucose: 134 mg/dL

## 2021-03-21 MED ORDER — SODIUM CHLORIDE 0.9 % IV SOLN
1.0000 g | INTRAVENOUS | Status: DC
  Administered 2021-03-21 – 2021-03-22 (×2): 1 g via INTRAVENOUS
  Filled 2021-03-21 (×2): qty 10

## 2021-03-21 NOTE — Progress Notes (Addendum)
PROGRESS NOTE    Kim Gonzalez  [id number] DOB: August 15, 1928 DOA: 03/19/2021 PCP: [name], PA  Chief Complaint  Patient presents with   Hip Pain    Fall last week     Brief Narrative:  88 yo with hx afib s/p watchman, CKD, diabetes, hypothyroidism, and multiple other medical problems who presents 7 days after recurrent falls.  She's found to have left ischial tuberosity avulsion fracture and near complete non retracted tears of the L hamstring tendon origin.  Non operative per ortho.  Plan for SNF.    Assessment & Plan:   Principal Problem:   Avulsion fracture of ischial tuberosity (HCC) Active Problems:   Fall   Dysuria   Chronic pain   Hypothyroidism   Essential hypertension   Overactive bladder   Atrial fibrillation (HCC)   Presence of Watchman left atrial appendage closure device   Diabetes (HCC)   Elevated serum creatinine   * Avulsion fracture of ischial tuberosity (HCC)- (present on admission) Fall with acute avulsion fx of L ischial tuberosity with 4x1.2 cm minimally displaced fx fragment Also with non complete non retracted tears of L hamstring tendon origin Orthopedic recommending pain control and WBAT, outpatient follow up - can use knee immobilizer when OOB if that helps Will admit for pain management, therapy, and placement if needed Scheduled APAP, prn norco, scheduled voltaren, lidocaine patch Therapy recommending SNF - will follow with SW  Fall- (present on admission) At baseline, wheelchair bound, can transfer [name] when transferring to toilet PT, follow  Dysuria Concerning for UTI UA concerning Follow culture ceftriaxone  Hypothyroidism Synthroid   Chronic pain Gabapentin, cymbalta   Essential hypertension Continue metoprolol Holding spironolactone and torsemide for now  Overactive bladder myrbetriq   Presence of Watchman left atrial appendage closure device- (present on admission) 2019, s/p watchman Continue beta  blocker  Atrial fibrillation ([name])- (present on admission) S/p watchman, beta blocker  Elevated serum creatinine Baseline appears to be around 1.29 follow  Diabetes ([place]) No longer taking any meds, follow a1c 6.3   DVT prophylaxis: lovenox Code Status: DNR Family Communication: none Disposition:   Status is: Observation  The patient will require care spanning > 2 midnights and should be moved to inpatient because: unsafe discharge, needs rehab   Consultants:  orthopedics  Procedures:  none  Antimicrobials:  Anti-infectives (From admission, onward)    Start     Dose/Rate Route Frequency Ordered Stop   03/21/21 1000  cefTRIAXone (ROCEPHIN) 1 g in sodium chloride 0.9 % 100 mL IVPB        1 g 200 mL/hr over 30 Minutes Intravenous Every 24 hours 03/21/21 0745         Subjective: No new complaints  Objective: Vitals:   03/20/21 0549 03/20/21 1459 03/20/21 2045 03/21/21 0500  BP: (!) 151/86 (!) 143/80 (!) 124/58   Pulse: 97 90 90   Resp: 16 20 16    Temp: 97.9 F (36.6 C) 98.1 F (36.7 C) 98 F (36.7 C)   TempSrc: Oral Oral Oral   SpO2: 96% 92% 97%   Weight:    99.1 kg  Height:    5' 3.5" (1.613 m)    Intake/Output Summary (Last 24 hours) at 03/21/2021 1046 Last data filed at 03/21/2021 1024 Gross per 24 hour  Intake 670 ml  Output 550 ml  Net 120 ml   Filed Weights   03/21/21 0500  Weight: 99.1 kg    Examination:  General: No acute distress. Cardiovascular: RRR  Lungs: unlabored Abdomen: Soft, nontender, nondistended  Neurological: Alert and oriented 3. Moves all extremities 4. Cranial nerves II through XII grossly intact. Skin: Warm and dry. No rashes or lesions. Extremities: No clubbing or cyanosis. No edema. Data Reviewed: I have personally reviewed following labs and imaging studies  CBC: Recent Labs  Lab 03/19/21 1820 03/20/21 0327  WBC 8.0 8.1  HGB 13.3 12.7  HCT 41.3 39.8  MCV 96.0 97.1  PLT 212 99991111    Basic Metabolic  Panel: Recent Labs  Lab 03/19/21 1820 03/20/21 0327  NA 135 136  K 4.3 4.1  CL 101 103  CO2 25 25  GLUCOSE 125* 156*  BUN 28* 33*  CREATININE 1.28*   1.26* 1.29*  CALCIUM 9.3 8.9    GFR: Estimated Creatinine Clearance: 32.9 mL/min ([name]) (by C-G formula based on SCr of 1.29 mg/dL (H)).  Liver Function Tests: Recent Labs  Lab 03/20/21 0327  AST 12*  ALT 10  ALKPHOS 67  BILITOT 0.8  PROT 6.7  ALBUMIN 3.4*    CBG: No results for input(s): GLUCAP in the last 168 hours.   Recent Results (from the past 240 hour(s))  Resp Panel by RT-PCR (Flu Nicklaus Alviar&B, Covid) Nasopharyngeal Swab     Status: None   Collection Time: 03/19/21  9:40 PM   Specimen: Nasopharyngeal Swab; Nasopharyngeal(NP) swabs in vial transport medium  Result Value Ref Range Status   SARS Coronavirus 2 by RT PCR NEGATIVE NEGATIVE Final    Comment: (NOTE) SARS-CoV-2 target nucleic acids are NOT DETECTED.  The SARS-CoV-2 RNA is generally detectable in upper respiratory specimens during the acute phase of infection. The lowest concentration of SARS-CoV-2 viral copies this assay can detect is 138 copies/mL. Sheretta Grumbine negative result does not preclude SARS-Cov-2 infection and should not be used as the sole basis for treatment or other patient management decisions. Lindy Pennisi negative result may occur with  improper specimen collection/handling, submission of specimen other than nasopharyngeal swab, presence of viral mutation(s) within the areas targeted by this assay, and inadequate number of viral copies(<138 copies/mL). Guerry Covington negative result must be combined with clinical observations, patient history, and epidemiological information. The expected result is Negative.  Fact Sheet for Patients:  EntrepreneurPulse.com.au  Fact Sheet for Healthcare Providers:  IncredibleEmployment.be  This test is no t yet approved or cleared by the [place] FDA and  has been authorized for detection and/or  diagnosis of SARS-CoV-2 by FDA under an Emergency Use Authorization (EUA). This EUA will remain  in effect (meaning this test can be used) for the duration of the COVID-19 declaration under Section 564(b)(1) of the Act, 21 U.S.C.section 360bbb-3(b)(1), unless the authorization is terminated  or revoked sooner.       Influenza Srinivas Lippman by PCR NEGATIVE NEGATIVE Final   Influenza B by PCR NEGATIVE NEGATIVE Final    Comment: (NOTE) The Xpert Xpress SARS-CoV-2/FLU/RSV plus assay is intended as an aid in the diagnosis of influenza from Nasopharyngeal swab specimens and should not be used as Caliope Ruppert sole basis for treatment. Nasal washings and aspirates are unacceptable for Xpert Xpress SARS-CoV-2/FLU/RSV testing.  Fact Sheet for Patients: EntrepreneurPulse.com.au  Fact Sheet for Healthcare Providers: IncredibleEmployment.be  This test is not yet approved or cleared by the [place] FDA and has been authorized for detection and/or diagnosis of SARS-CoV-2 by FDA under an Emergency Use Authorization (EUA). This EUA will remain in effect (meaning this test can be used) for the duration of the COVID-19 declaration under Section 564(b)(1) of the Act,  21 U.S.C. section 360bbb-3(b)(1), unless the authorization is terminated or revoked.  Performed at [facility], [place] [address], [place], [place] [address]          Radiology Studies: MR HIP LEFT WO CONTRAST  Result Date: 03/19/2021 CLINICAL DATA:  Left hip pain.  Fall 5 days ago EXAM: MR OF THE LEFT HIP WITHOUT CONTRAST TECHNIQUE: Multiplanar, multisequence MR imaging was performed. No intravenous contrast was administered. COMPARISON:  X-ray 03/19/2021 FINDINGS: Bones: Acute avulsion fracture of the left ischial tuberosity with an approximately 4.0 x 1.2 cm minimally displaced fracture fragment (series 8, image 15). Marked bone marrow edema within the left ischium. Near-complete non-retracted  tears of the left hamstring tendon origin. No additional fracture. No dislocation. No femoral head avascular necrosis. No pelvic diastasis. Degenerative disc disease of the visualized lumbar spine. No marrow replacing bone lesion. Articular cartilage and labrum Articular cartilage:  Mild chondral thinning. Labrum:  Grossly intact on non-arthrographic imaging. Joint or bursal effusion Joint effusion:  None. Bursae: No abnormal bursal fluid collection. Muscles and tendons Muscles and tendons: Near complete left hamstring avulsion. The gluteal, iliopsoas, rectus femoris, and adductor tendons appear intact. Generalized muscle atrophy. Fatty infiltration of the bilateral gluteal musculature. Other findings Miscellaneous: Small ill-defined hematoma at the fracture site. No inguinal lymphadenopathy. Scattered diverticular changes within the visualized sigmoid colon. IMPRESSION: 1. Acute avulsion fracture of the left ischial tuberosity with an approximately 4.0 x 1.2 cm minimally displaced fracture fragment. 2. Near-complete non-retracted tears of the left hamstring tendon origin. Electronically Signed   By: Davina Poke D.O.   On: 03/19/2021 15:20   DG Hip Unilat With Pelvis 2-3 Views Left  Result Date: 03/19/2021 CLINICAL DATA:  Status post fall, left hip pain EXAM: DG HIP (WITH OR WITHOUT PELVIS) 2-3V LEFT COMPARISON:  None. FINDINGS: No acute fracture or dislocation. No aggressive osseous lesion. Normal alignment. Lower lumbar spine spondylosis Soft tissue are unremarkable. No radiopaque foreign body or soft tissue emphysema. IMPRESSION: No acute osseous injury of the left hip. Electronically Signed   By: Kathreen Devoid M.D.   On: 03/19/2021 12:47        Scheduled Meds:  acetaminophen  500 mg Oral Q6H   buPROPion  300 mg Oral Daily   diclofenac Sodium  4 g Topical QID   DULoxetine  60 mg Oral Daily   enoxaparin (LOVENOX) injection  40 mg Subcutaneous Q24H   gabapentin  300 mg Oral QHS   levothyroxine   88 mcg Oral QAC breakfast   lidocaine  1 patch Transdermal Q24H   metoprolol succinate  25 mg Oral Daily   mirabegron ER  50 mg Oral Daily   rosuvastatin  20 mg Oral Daily   Continuous Infusions:  cefTRIAXone (ROCEPHIN)  IV 1 g (03/21/21 0956)     LOS: 0 days    Time spent: over 30 min    Fayrene Helper, MD [facility]   To contact the attending provider between 7A-7P or the covering provider during after hours 7P-7A, please log into the web site [email or web address] and access using universal [place] password for that web site. If you do not have the password, please call the hospital operator.  03/21/2021, 10:46 AM

## 2021-03-21 NOTE — Assessment & Plan Note (Addendum)
Dysuria UA concerning for UTI Urine culture with staph aureus and enterococcus faecalis - MRSA and e faecalis sensitive to amoxicllin Urine was taken from [place] cannister, also she has chronic dysuria - ? Contaminants Given staph aureus in urine, follow blood cultures - blood cultures so far no growth, but as above, given collection method and her chronic symptoms, not sure these represent true infections Appreciate ID recommendations -> fosfomycin x1 today, then follow outpatient with PCP to follow symptoms

## 2021-03-22 DIAGNOSIS — S32613A Displaced avulsion fracture of unspecified ischium, initial encounter for closed fracture: Secondary | ICD-10-CM | POA: Diagnosis not present

## 2021-03-22 MED ORDER — VANCOMYCIN HCL 1500 MG/300ML IV SOLN
1500.0000 mg | Freq: Once | INTRAVENOUS | Status: AC
  Administered 2021-03-22: 1500 mg via INTRAVENOUS
  Filled 2021-03-22: qty 300

## 2021-03-22 MED ORDER — VANCOMYCIN HCL 750 MG/150ML IV SOLN: 750.0000 mg | INTRAVENOUS | Status: DC

## 2021-03-22 NOTE — Progress Notes (Signed)
Pharmacy Antibiotic Note  Kim Gonzalez is a 88 y.o. female admitted on 03/19/2021 with UTI.  Pharmacy has been consulted for vancomycin  dosing. Pt is being started on vancomycin after positive urine cultures for Staph aureus and Enterococcus faecalis.   Plan: Vancomycin 1500 mg IV x1 then 750 mg IV q24h  Monitor clinical course, renal function, cultures as available   Height: 5' 3.5" (161.3 cm) Weight: 100.7 kg (222 lb 0.1 oz) IBW/kg (Calculated) : 53.55  Temp (24hrs), Avg:97.7 F (36.5 C), Min:97.5 F (36.4 C), Max:98.2 F (36.8 C)  Recent Labs  Lab 03/19/21 1820 03/20/21 0327  WBC 8.0 8.1  CREATININE 1.28*   1.26* 1.29*    Estimated Creatinine Clearance: 33.1 mL/min (A) (by C-G formula based on SCr of 1.29 mg/dL (H)).    Allergies  Allergen Reactions   Amoxicillin Diarrhea   Aspirin     unknown   Ciprofibrate     unknown   Dilantin [Phenytoin]     unknown   Doxycycline Hives   Erythromycin     Other reaction(s): UPSET STOMACH   Fentanyl     Other reaction(s): altered mental states   Ibuprofen     Upset stomach    Lovastatin     unknown   Macrodantin [Nitrofurantoin]     unknown   Oxycodone     Other reaction(s): unbalanced   Oxycodone-Acetaminophen     Upset stomach    Sulfamethoxazole-Trimethoprim     Stomach pain    Antimicrobials this admission: 1/28 ceftriaxone >> 1/29 1/29 vancomycin >>   Dose adjustments this admission:     Microbiology results:  1/29 BCx:  1/27 UCx: > 100k colonies of Staph aureus, 50k colonies E. Faecalis      Thank you for allowing pharmacy to be a part of this patients care.   Royetta Asal, PharmD, BCPS 03/22/2021 1:59 PM

## 2021-03-22 NOTE — TOC Progression Note (Addendum)
Transition of Care [facility]) - Progression Note    Patient Details  Name: Kim Gonzalez MRN: [id number] Date of Birth: December 16, 1928  Transition of Care [facility]) CM/SW Contact  Darleene Cleaver, [place] Phone Number: 03/22/2021, 6:12 PM  Clinical Narrative:     CSW spoke to patient to discuss bed offers, and she deferred CSW to her son Kim Gonzalez.  CSW contacted patient's son Kim Gonzalez, and told him about the two bed offers, he wants to see what other facilities may offer beds tomorrow.  CSW asked if they would like CSW to try [facility] and [facility], and he said yes that would be fine as well.  CSW faxed updated clinicals to SNFs in [place] and [place] per family request.    CSW explained to son how insurance pays for stay at [facility] based on therapy progress.  Patient's son thought that insurance will pay for 100 days, CSW explained to him that is only if therapy can be justified for that long.  CSW explained to him that most of the time, patients do not use the whole 100 days.  CSW explained if patient stops making progress, then they will have to look at having her come home with home health or paying privately.  CSW to continue to follow patient's progress throughout discharge planning.   Expected Discharge Plan: Skilled Nursing Facility Barriers to Discharge: SNF Pending bed offer  Expected Discharge Plan and Services Expected Discharge Plan: Skilled Nursing Facility In-house Referral: Clinical Social Work   Post Acute Care Choice: Skilled Nursing Facility Living arrangements for the past 2 months: Single Family Home                 DME Arranged: N/A DME Agency: NA                   Social Determinants of Health (SDOH) Interventions    Readmission Risk Interventions No flowsheet data found.

## 2021-03-22 NOTE — Progress Notes (Signed)
PROGRESS NOTE    Kim Gonzalez  [id number] DOB: 18-Nov-1928 DOA: 03/19/2021 PCP: [name], PA  Chief Complaint  Patient presents with   Hip Pain    Fall last week     Brief Narrative:  88 yo with hx afib s/p watchman, CKD, diabetes, hypothyroidism, and multiple other medical problems who presents 7 days after recurrent falls.  She's found to have left ischial tuberosity avulsion fracture and near complete non retracted tears of the L hamstring tendon origin.  Non operative per ortho.  Plan for SNF.    Assessment & Plan:   Principal Problem:   Avulsion fracture of ischial tuberosity (HCC) Active Problems:   Fall   Acute lower UTI   Chronic pain   Hypothyroidism   Essential hypertension   Overactive bladder   Atrial fibrillation (HCC)   Presence of Watchman left atrial appendage closure device   Diabetes (HCC)   Elevated serum creatinine   * Avulsion fracture of ischial tuberosity (HCC)- (present on admission) Fall with acute avulsion fx of L ischial tuberosity with 4x1.2 cm minimally displaced fx fragment Also with non complete non retracted tears of L hamstring tendon origin Orthopedic recommending pain control and WBAT, outpatient follow up - can use knee immobilizer when OOB if that helps Will admit for pain management, therapy, and placement if needed Scheduled APAP, prn norco, scheduled voltaren, lidocaine patch Therapy recommending SNF - will follow with SW  Fall- (present on admission) At baseline, wheelchair bound, can transfer [place] when transferring to toilet PT, follow  Acute lower UTI Dysuria UA concerning for UTI Urine culture with staph aureus and enterococcus faecalis - follow final cultures Given staph aureus in urine, follow blood cultures  Hypothyroidism Synthroid   Chronic pain Gabapentin, cymbalta   Essential hypertension Continue metoprolol Holding spironolactone and torsemide for now  Overactive bladder myrbetriq    Presence of Watchman left atrial appendage closure device- (present on admission) 2019, s/p watchman Continue beta blocker  Atrial fibrillation ([place])- (present on admission) S/p watchman, beta blocker  Elevated serum creatinine Baseline appears to be around 1.29 follow  Diabetes ([place]) No longer taking any meds, follow a1c 6.3   DVT prophylaxis: lovenox Code Status: DNR Family Communication: none Disposition:   Status is: Observation  The patient will require care spanning > 2 midnights and should be moved to inpatient because: unsafe discharge, needs rehab   Consultants:  orthopedics  Procedures:  none  Antimicrobials:  Anti-infectives (From admission, onward)    Start     Dose/Rate Route Frequency Ordered Stop   03/21/21 1000  cefTRIAXone (ROCEPHIN) 1 g in sodium chloride 0.9 % 100 mL IVPB        1 g 200 mL/hr over 30 Minutes Intravenous Every 24 hours 03/21/21 0745         Subjective: No new complaints  Objective: Vitals:   03/21/21 2104 03/22/21 0457 03/22/21 0500 03/22/21 0900  BP: (!) 152/73 112/84  (!) 118/56  Pulse: (!) 105 87  86  Resp: 18 16  18   Temp: 98.2 F (36.8 C) (!) 97.5 F (36.4 C)  97.6 F (36.4 C)  TempSrc:    Oral  SpO2: 98% 97%  100%  Weight:   100.7 kg   Height:        Intake/Output Summary (Last 24 hours) at 03/22/2021 1330 Last data filed at 03/22/2021 1000 Gross per 24 hour  Intake 570 ml  Output 550 ml  Net 20 ml   [employer]  03/21/21 0500 03/22/21 0500  Weight: 99.1 kg 100.7 kg    Examination:  General: No acute distress. Cardiovascular: RRR Lungs: unlabored Abdomen: Soft, nontender, nondistended Neurological: Alert and oriented 3. Moves all extremities 4. Cranial nerves II through XII grossly intact. Skin: Warm and dry. No rashes or lesions. Extremities: No clubbing or cyanosis. No edema.  Data Reviewed: I have personally reviewed following labs and imaging studies  CBC: Recent Labs  Lab  03/19/21 1820 03/20/21 0327  WBC 8.0 8.1  HGB 13.3 12.7  HCT 41.3 39.8  MCV 96.0 97.1  PLT 212 99991111    Basic Metabolic Panel: Recent Labs  Lab 03/19/21 1820 03/20/21 0327  NA 135 136  K 4.3 4.1  CL 101 103  CO2 25 25  GLUCOSE 125* 156*  BUN 28* 33*  CREATININE 1.28*   1.26* 1.29*  CALCIUM 9.3 8.9    GFR: Estimated Creatinine Clearance: 33.1 mL/min (Marveen Donlon) (by C-G formula based on SCr of 1.29 mg/dL (H)).  Liver Function Tests: Recent Labs  Lab 03/20/21 0327  AST 12*  ALT 10  ALKPHOS 67  BILITOT 0.8  PROT 6.7  ALBUMIN 3.4*    CBG: No results for input(s): GLUCAP in the last 168 hours.   Recent Results (from the past 240 hour(s))  Resp Panel by RT-PCR (Flu Rondarius Kadrmas&B, Covid) Nasopharyngeal Swab     Status: None   Collection Time: 03/19/21  9:40 PM   Specimen: Nasopharyngeal Swab; Nasopharyngeal(NP) swabs in vial transport medium  Result Value Ref Range Status   SARS Coronavirus 2 by RT PCR NEGATIVE NEGATIVE Final    Comment: (NOTE) SARS-CoV-2 target nucleic acids are NOT DETECTED.  The SARS-CoV-2 RNA is generally detectable in upper respiratory specimens during the acute phase of infection. The lowest concentration of SARS-CoV-2 viral copies this assay can detect is 138 copies/mL. Kyan Giannone negative result does not preclude SARS-Cov-2 infection and should not be used as the sole basis for treatment or other patient management decisions. Jordynn Marcella negative result may occur with  improper specimen collection/handling, submission of specimen other than nasopharyngeal swab, presence of viral mutation(s) within the areas targeted by this assay, and inadequate number of viral copies(<138 copies/mL). Tyja Gortney negative result must be combined with clinical observations, patient history, and epidemiological information. The expected result is Negative.  Fact Sheet for Patients:  EntrepreneurPulse.com.au  Fact Sheet for Healthcare Providers:   IncredibleEmployment.be  This test is no t yet approved or cleared by the [place] FDA and  has been authorized for detection and/or diagnosis of SARS-CoV-2 by FDA under an Emergency Use Authorization (EUA). This EUA will remain  in effect (meaning this test can be used) for the duration of the COVID-19 declaration under Section 564(b)(1) of the Act, 21 U.S.C.section 360bbb-3(b)(1), unless the authorization is terminated  or revoked sooner.       Influenza Armeda Plumb by PCR NEGATIVE NEGATIVE Final   Influenza B by PCR NEGATIVE NEGATIVE Final    Comment: (NOTE) The Xpert Xpress SARS-CoV-2/FLU/RSV plus assay is intended as an aid in the diagnosis of influenza from Nasopharyngeal swab specimens and should not be used as Deveion Denz sole basis for treatment. Nasal washings and aspirates are unacceptable for Xpert Xpress SARS-CoV-2/FLU/RSV testing.  Fact Sheet for Patients: EntrepreneurPulse.com.au  Fact Sheet for Healthcare Providers: IncredibleEmployment.be  This test is not yet approved or cleared by the [place] FDA and has been authorized for detection and/or diagnosis of SARS-CoV-2 by FDA under an Emergency Use Authorization (EUA). This EUA will remain  in effect (meaning this test can be used) for the duration of the COVID-19 declaration under Section 564(b)(1) of the Act, 21 U.S.C. section 360bbb-3(b)(1), unless the authorization is terminated or revoked.  Performed at [facility], [place] [address], [place], [place] [address]   Urine Culture     Status: Abnormal (Preliminary result)   Collection Time: 03/20/21  5:50 PM   Specimen: Urine, Clean Catch  Result Value Ref Range Status   Specimen Description   Final    URINE, CLEAN CATCH Performed at [facility], [place] [address] [place], [place] [address]    Special Requests   Final    NONE Performed at [facility], [place] [address], [place], [place] [address]    Culture (Niurka Benecke)  Final    >=100,000 COLONIES/mL STAPHYLOCOCCUS AUREUS 50,000 COLONIES/mL ENTEROCOCCUS FAECALIS CULTURE REINCUBATED FOR BETTER GROWTH Performed at [facility], [place] [address] [place], [place] [address]    Report Status PENDING  Incomplete         Radiology Studies: No results found.      Scheduled Meds:  acetaminophen  500 mg Oral Q6H   buPROPion  300 mg Oral Daily   diclofenac Sodium  4 g Topical QID   DULoxetine  60 mg Oral Daily   enoxaparin (LOVENOX) injection  40 mg Subcutaneous Q24H   gabapentin  300 mg Oral QHS   levothyroxine  88 mcg Oral QAC breakfast   lidocaine  1 patch Transdermal Q24H   metoprolol succinate  25 mg Oral Daily   mirabegron ER  50 mg Oral Daily   rosuvastatin  20 mg Oral Daily   Continuous Infusions:  cefTRIAXone (ROCEPHIN)  IV 1 g (03/22/21 1123)     LOS: 1 day    Time spent: over 30 min    Fayrene Helper, MD [facility]   To contact the attending provider between 7A-7P or the covering provider during after hours 7P-7A, please log into the web site [email or web address] and access using universal [place] password for that web site. If you do not have the password, please call the hospital operator.  03/22/2021, 1:30 PM

## 2021-03-22 NOTE — Plan of Care (Signed)
°  Problem: Education: Goal: Individualized Educational Video(s) Outcome: Progressing   Problem: Pain Management: Goal: Pain level will decrease Outcome: Progressing   Problem: Pain Managment: Goal: General experience of comfort will improve Outcome: Progressing   Problem: Skin Integrity: Goal: Risk for impaired skin integrity will decrease Outcome: Progressing

## 2021-03-23 DIAGNOSIS — S32613A Displaced avulsion fracture of unspecified ischium, initial encounter for closed fracture: Secondary | ICD-10-CM | POA: Diagnosis not present

## 2021-03-23 LAB — CBC WITH DIFFERENTIAL/PLATELET
Abs Immature Granulocytes: 0.02 10*3/uL (ref 0.00–0.07)
Basophils Absolute: 0.1 10*3/uL (ref 0.0–0.1)
Basophils Relative: 1 %
Eosinophils Absolute: 0.2 10*3/uL (ref 0.0–0.5)
Eosinophils Relative: 3 %
HCT: 38.6 % (ref 36.0–46.0)
Hemoglobin: 12.1 g/dL (ref 12.0–15.0)
Immature Granulocytes: 0 %
Lymphocytes Relative: 42 %
Lymphs Abs: 2.8 10*3/uL (ref 0.7–4.0)
MCH: 31.2 pg (ref 26.0–34.0)
MCHC: 31.3 g/dL (ref 30.0–36.0)
MCV: 99.5 fL (ref 80.0–100.0)
Monocytes Absolute: 0.9 10*3/uL (ref 0.1–1.0)
Monocytes Relative: 13 %
Neutro Abs: 2.7 10*3/uL (ref 1.7–7.7)
Neutrophils Relative %: 41 %
Platelets: 200 10*3/uL (ref 150–400)
RBC: 3.88 MIL/uL (ref 3.87–5.11)
RDW: 13.2 % (ref 11.5–15.5)
WBC: 6.6 10*3/uL (ref 4.0–10.5)
nRBC: 0 % (ref 0.0–0.2)

## 2021-03-23 LAB — COMPREHENSIVE METABOLIC PANEL
ALT: 10 U/L (ref 0–44)
AST: 16 U/L (ref 15–41)
Albumin: 3.1 g/dL — ABNORMAL LOW (ref 3.5–5.0)
Alkaline Phosphatase: 70 U/L (ref 38–126)
Anion gap: 8 (ref 5–15)
BUN: 22 mg/dL (ref 8–23)
CO2: 25 mmol/L (ref 22–32)
Calcium: 8.5 mg/dL — ABNORMAL LOW (ref 8.9–10.3)
Chloride: 103 mmol/L (ref 98–111)
Creatinine, Ser: 1.4 mg/dL — ABNORMAL HIGH (ref 0.44–1.00)
GFR, Estimated: 36 mL/min — ABNORMAL LOW (ref >60)
Glucose, Bld: 144 mg/dL — ABNORMAL HIGH (ref 70–99)
Potassium: 4.3 mmol/L (ref 3.5–5.1)
Sodium: 136 mmol/L (ref 135–145)
Total Bilirubin: 0.7 mg/dL (ref 0.3–1.2)
Total Protein: 6.4 g/dL — ABNORMAL LOW (ref 6.5–8.1)

## 2021-03-23 LAB — MAGNESIUM: Magnesium: 1.8 mg/dL (ref 1.7–2.4)

## 2021-03-23 LAB — PHOSPHORUS: Phosphorus: 4.2 mg/dL (ref 2.5–4.6)

## 2021-03-23 LAB — RESP PANEL BY RT-PCR (FLU A&B, COVID) ARPGX2
Influenza A by PCR: NEGATIVE
Influenza B by PCR: NEGATIVE
SARS Coronavirus 2 by RT PCR: NEGATIVE

## 2021-03-23 MED ORDER — VANCOMYCIN HCL 500 MG/100ML IV SOLN
500.0000 mg | INTRAVENOUS | Status: DC
  Administered 2021-03-23: 500 mg via INTRAVENOUS
  Filled 2021-03-23: qty 100

## 2021-03-23 NOTE — Progress Notes (Signed)
PROGRESS NOTE    Kim Gonzalez  [id number] DOB: 31-Oct-1928 DOA: 03/19/2021 PCP: [name], PA  Chief Complaint  Patient presents with   Hip Pain    Fall last week     Brief Narrative:  88 yo with hx afib s/p watchman, CKD, diabetes, hypothyroidism, and multiple other medical problems who presents 7 days after recurrent falls.  She's found to have left ischial tuberosity avulsion fracture and near complete non retracted tears of the L hamstring tendon origin.  Non operative per ortho.  Hospitalization complicated by dysuria and urine positive for UTI, she's been treated with antibiotics.  Plan for SNF, likely 1/31.    Assessment & Plan:   Principal Problem:   Avulsion fracture of ischial tuberosity (HCC) Active Problems:   Fall   Acute lower UTI   Chronic pain   Hypothyroidism   Essential hypertension   Overactive bladder   Atrial fibrillation (HCC)   Presence of Watchman left atrial appendage closure device   Diabetes (HCC)   Elevated serum creatinine  Assessment and Plan: * Avulsion fracture of ischial tuberosity (HCC)- (present on admission) Fall with acute avulsion fx of L ischial tuberosity with 4x1.2 cm minimally displaced fx fragment Also with non complete non retracted tears of L hamstring tendon origin Orthopedic recommending pain control and WBAT, outpatient follow up - can use knee immobilizer when OOB if that helps Will admit for pain management, therapy, and placement if needed Scheduled APAP, prn norco, scheduled voltaren, lidocaine patch Therapy recommending SNF - will discharge to SNF in AM  Fall- (present on admission) At baseline, wheelchair bound, can transfer [name] when transferring to toilet PT, follow  Acute lower UTI Dysuria UA concerning for UTI Urine culture with staph aureus and enterococcus faecalis - follow final cultures Given staph aureus in urine, follow blood cultures Awaiting final  sensitivities  Hypothyroidism Synthroid   Chronic pain Gabapentin, cymbalta   Essential hypertension Continue metoprolol Holding spironolactone and torsemide for now  Overactive bladder myrbetriq   Presence of Watchman left atrial appendage closure device- (present on admission) 2019, s/p watchman Continue beta blocker  Atrial fibrillation ([place])- (present on admission) S/p watchman, beta blocker  Elevated serum creatinine Baseline appears to be around 1.29 follow  Diabetes ([place]) No longer taking any meds, follow a1c 6.3   DVT prophylaxis: lovenox Code Status: DNR Family Communication: none Disposition:   Status is: Observation  The patient will require care spanning > 2 midnights and should be moved to inpatient because: unsafe discharge, needs rehab   Consultants:  orthopedics  Procedures:  none  Antimicrobials:  Anti-infectives (From admission, onward)    Start     Dose/Rate Route Frequency Ordered Stop   03/23/21 2200  vancomycin (VANCOREADY) IVPB 500 mg/100 mL        500 mg 100 mL/hr over 60 Minutes Intravenous Every 24 hours 03/23/21 1220     03/23/21 1800  vancomycin (VANCOREADY) IVPB 750 mg/150 mL  Status:  Discontinued        750 mg 150 mL/hr over 60 Minutes Intravenous Every 24 hours 03/22/21 1358 03/23/21 1220   03/22/21 1800  vancomycin (VANCOREADY) IVPB 1500 mg/300 mL        1,500 mg 150 mL/hr over 120 Minutes Intravenous  Once 03/22/21 1358 03/22/21 1934   03/21/21 1000  cefTRIAXone (ROCEPHIN) 1 g in sodium chloride 0.9 % 100 mL IVPB  Status:  Discontinued        1 g 200 mL/hr over 30 Minutes  Intravenous Every 24 hours 03/21/21 0745 03/22/21 1336       Subjective: No new complaints  Objective: Vitals:   03/22/21 2039 03/23/21 0557 03/23/21 0558 03/23/21 1303  BP: 121/64 140/70  (!) 142/77  Pulse: 99 97  92  Resp: 16 16  16   Temp: 98.6 F (37 C) 98 F (36.7 C)  98 F (36.7 C)  TempSrc: Oral Oral  Oral  SpO2: 94% 97%  96%   Weight:   102 kg   Height:        Intake/Output Summary (Last 24 hours) at 03/23/2021 1741 Last data filed at 03/23/2021 1532 Gross per 24 hour  Intake 940 ml  Output 1450 ml  Net -510 ml   Filed Weights   03/21/21 0500 03/22/21 0500 03/23/21 0558  Weight: 99.1 kg 100.7 kg 102 kg    Examination:  General: No acute distress. Cardiovascular: RRR Lungs: unlabored Abdomen: Soft, nontender, nondistended Neurological: Alert and oriented 3. Moves all extremities 4 . Cranial nerves II through XII grossly intact. Skin: Warm and dry. No rashes or lesions. Extremities: No clubbing or cyanosis. No edema.    Data Reviewed: I have personally reviewed following labs and imaging studies  CBC: Recent Labs  Lab 03/19/21 1820 03/20/21 0327 03/23/21 0324  WBC 8.0 8.1 6.6  NEUTROABS  --   --  2.7  HGB 13.3 12.7 12.1  HCT 41.3 39.8 38.6  MCV 96.0 97.1 99.5  PLT 212 205 [id number]    Basic Metabolic Panel: Recent Labs  Lab 03/19/21 1820 03/20/21 0327 03/23/21 0324  NA 135 136 136  K 4.3 4.1 4.3  CL 101 103 103  CO2 25 25 25   GLUCOSE 125* 156* 144*  BUN 28* 33* 22  CREATININE 1.28*   1.26* 1.29* 1.40*  CALCIUM 9.3 8.9 8.5*  MG  --   --  1.8  PHOS  --   --  4.2    GFR: Estimated Creatinine Clearance: 30.8 mL/min (Jasie Meleski) (by C-G formula based on SCr of 1.4 mg/dL (H)).  Liver Function Tests: Recent Labs  Lab 03/20/21 0327 03/23/21 0324  AST 12* 16  ALT 10 10  ALKPHOS 67 70  BILITOT 0.8 0.7  PROT 6.7 6.4*  ALBUMIN 3.4* 3.1*    CBG: No results for input(s): GLUCAP in the last 168 hours.   Recent Results (from the past 240 hour(s))  Resp Panel by RT-PCR (Flu Giacomo Valone&B, Covid) Nasopharyngeal Swab     Status: None   Collection Time: 03/19/21  9:40 PM   Specimen: Nasopharyngeal Swab; Nasopharyngeal(NP) swabs in vial transport medium  Result Value Ref Range Status   SARS Coronavirus 2 by RT PCR NEGATIVE NEGATIVE Final    Comment: (NOTE) SARS-CoV-2 target nucleic acids are NOT  DETECTED.  The SARS-CoV-2 RNA is generally detectable in upper respiratory specimens during the acute phase of infection. The lowest concentration of SARS-CoV-2 viral copies this assay can detect is 138 copies/mL. Lavi Sheehan negative result does not preclude SARS-Cov-2 infection and should not be used as the sole basis for treatment or other patient management decisions. Kaiah Hosea negative result may occur with  improper specimen collection/handling, submission of specimen other than nasopharyngeal swab, presence of viral mutation(s) within the areas targeted by this assay, and inadequate number of viral copies(<138 copies/mL). Janasia Coverdale negative result must be combined with clinical observations, patient history, and epidemiological information. The expected result is Negative.  Fact Sheet for Patients:  EntrepreneurPulse.com.au  Fact Sheet for Healthcare Providers:  IncredibleEmployment.be  This  test is no t yet approved or cleared by the [place] and  has been authorized for detection and/or diagnosis of SARS-CoV-2 by FDA under an Emergency Use Authorization (EUA). This EUA will remain  in effect (meaning this test can be used) for the duration of the COVID-19 declaration under Section 564(b)(1) of the Act, 21 U.S.C.section 360bbb-3(b)(1), unless the authorization is terminated  or revoked sooner.       Influenza Zachariah Pavek by PCR NEGATIVE NEGATIVE Final   Influenza B by PCR NEGATIVE NEGATIVE Final    Comment: (NOTE) The Xpert Xpress SARS-CoV-2/FLU/RSV plus assay is intended as an aid in the diagnosis of influenza from Nasopharyngeal swab specimens and should not be used as Concepcion Kirkpatrick sole basis for treatment. Nasal washings and aspirates are unacceptable for Xpert Xpress SARS-CoV-2/FLU/RSV testing.  Fact Sheet for Patients: EntrepreneurPulse.com.au  Fact Sheet for Healthcare Providers: IncredibleEmployment.be  This test is not yet  approved or cleared by the [place] FDA and has been authorized for detection and/or diagnosis of SARS-CoV-2 by FDA under an Emergency Use Authorization (EUA). This EUA will remain in effect (meaning this test can be used) for the duration of the COVID-19 declaration under Section 564(b)(1) of the Act, 21 U.S.C. section 360bbb-3(b)(1), unless the authorization is terminated or revoked.  Performed at [facility], [place] [address], [place], [place] [address]   Urine Culture     Status: Abnormal (Preliminary result)   Collection Time: 03/20/21  5:50 PM   Specimen: Urine, Clean Catch  Result Value Ref Range Status   Specimen Description   Final    URINE, CLEAN CATCH Performed at [facility], [place] [address], [place], [place] [address]    Special Requests   Final    NONE Performed at [facility], [place] [address], [place], [place] [address]    Culture (Kasin Tonkinson)  Final    >=100,000 COLONIES/mL STAPHYLOCOCCUS AUREUS 50,000 COLONIES/mL ENTEROCOCCUS FAECALIS SUSCEPTIBILITIES TO FOLLOW Performed at [facility], [place] [address], [place], [place] [address]    Report Status PENDING  Incomplete  Culture, blood (routine x 2)     Status: None (Preliminary result)   Collection Time: 03/22/21  1:48 PM   Specimen: BLOOD  Result Value Ref Range Status   Specimen Description   Final    BLOOD BLOOD LEFT FOREARM Performed at [place] [address], [place], [place] [address]    Special Requests   Final    BOTTLES DRAWN AEROBIC AND ANAEROBIC Blood Culture adequate volume Performed at [place] [address], [place], [place] [address]    Culture   Final    NO GROWTH < 24 HOURS Performed at [place] [address], [place], [place] [address]    Report Status PENDING  Incomplete  Culture, blood (routine x 2)     Status: None (Preliminary result)   Collection Time: 03/22/21   1:48 PM   Specimen: BLOOD  Result Value Ref Range Status   Specimen Description   Final    BLOOD RIGHT ANTECUBITAL Performed at [place] [address] [place], [place] [address]    Special Requests   Final    BOTTLES DRAWN AEROBIC AND ANAEROBIC Blood Culture adequate volume Performed at [place] [address], [place], [place] [address]    Culture   Final    NO GROWTH < 24 HOURS Performed at [facility]  Lab, [address] [place], [place] [address]    Report Status PENDING  Incomplete         Radiology Studies: No results found.      Scheduled Meds:  buPROPion  300 mg Oral Daily   diclofenac Sodium  4 g Topical QID   DULoxetine  60 mg Oral Daily   enoxaparin (LOVENOX) injection  40 mg Subcutaneous Q24H   gabapentin  300 mg Oral QHS   levothyroxine  88 mcg Oral QAC breakfast   lidocaine  1 patch Transdermal Q24H   metoprolol succinate  25 mg Oral Daily   mirabegron ER  50 mg Oral Daily   rosuvastatin  20 mg Oral Daily   Continuous Infusions:  vancomycin       LOS: 2 days    Time spent: over 30 min    Fayrene Helper, MD [facility]   To contact the attending provider between 7A-7P or the covering provider during after hours 7P-7A, please log into the web site [email or web address] and access using universal [place] password for that web site. If you do not have the password, please call the hospital operator.  03/23/2021, 5:41 PM

## 2021-03-23 NOTE — Progress Notes (Signed)
Pharmacy Antibiotic Note  Kim Gonzalez is a 88 y.o. female admitted on 03/19/2021 with UTI.  Pharmacy has been consulted for vancomycin  dosing. Pt is being started on vancomycin after positive urine cultures for Staph aureus and Enterococcus faecalis. Note presence of watchman device for Afib.  Today, 03/23/2021: D2 abx WBC/Temps remain stable WNL SCr trending up after starting vancomycin  Plan: Reduce vancomycin to 500 mg IV q24 hr (eAUC 425 w/ SCr 1.4; Vd 0.5) SCr daily while on vanc - if continues to worsen may need to consider alternative coverage   Height: 5' 3.5" (161.3 cm) Weight: 102 kg (224 lb 13.9 oz) IBW/kg (Calculated) : 53.55  Temp (24hrs), Avg:98.2 F (36.8 C), Min:98 F (36.7 C), Max:98.6 F (37 C)  Recent Labs  Lab 03/19/21 1820 03/20/21 0327 03/23/21 0324  WBC 8.0 8.1 6.6  CREATININE 1.28*   1.26* 1.29* 1.40*     Estimated Creatinine Clearance: 30.8 mL/min (A) (by C-G formula based on SCr of 1.4 mg/dL (H)).    Allergies  Allergen Reactions   Amoxicillin Diarrhea   Aspirin     unknown   Ciprofibrate     unknown   Dilantin [Phenytoin]     unknown   Doxycycline Hives   Erythromycin     Other reaction(s): UPSET STOMACH   Fentanyl     Other reaction(s): altered mental states   Ibuprofen     Upset stomach    Lovastatin     unknown   Macrodantin [Nitrofurantoin]     unknown   Oxycodone     Other reaction(s): unbalanced   Oxycodone-Acetaminophen     Upset stomach    Sulfamethoxazole-Trimethoprim     Stomach pain    Antimicrobials this admission:  1/28 ceftriaxone >> 1/29 1/29 vancomycin >>   Dose adjustments this admission:  1/30 reduce vanc 750 >> 500 q24 for worsening SCr  Microbiology results:  1/29 BCx: ngtd 1/27 UCx: > 100k colonies of Staph aureus, 50k colonies E. Faecalis      Thank you for allowing pharmacy to be a part of this patients care.   Bernadene Person, PharmD, BCPS [phone] 03/23/2021, 12:37 PM

## 2021-03-23 NOTE — TOC Progression Note (Signed)
Transition of Care [facility]) - Progression Note   Patient Details  Name: Kim Gonzalez MRN: [id number] Date of Birth: 1928-07-28  Transition of [facility]) CM/SW Contact  Ewing Schlein, LCSW Phone Number: 03/23/2021, 2:50 PM  Clinical Narrative: CSW discussed bed options with son as he wants the patient to transition to long-term care after rehab at a facility. Son is aware this will limit bed options. CSW received bed offer from [facility] with [facility] and confirmed the facility can accept patient for rehab and then change to LTC pending negative COVID test. Facility can admit patient tomorrow. CSW requested COVID test from hospitalist.  Expected Discharge Plan: Skilled Nursing Facility Barriers to Discharge: SNF Pending bed offer  Expected Discharge Plan and Services Expected Discharge Plan: Skilled Nursing Facility In-house Referral: Clinical Social Work Post Acute Care Choice: Skilled Nursing Facility Living arrangements for the past 2 months: Single Family Home           DME Arranged: N/A DME Agency: NA  Readmission Risk Interventions No flowsheet data found.

## 2021-03-23 NOTE — Progress Notes (Signed)
Physical Therapy Treatment Patient Details Name: Kim Gonzalez MRN: [id number] DOB: 04/08/1928 Today's Date: 03/23/2021   History of Present Illness Pt is a 88 y.o. female who presents following recurrent falls at home.  Found to have acute avulsion fx of L ischial tuberosity and non complete non retracted tears of L hamstring tendon origin. PMH significant for hx afib s/p watchman, CKD, diabetes, and hypothyroidism.    PT Comments    General Comments: AxO x 3 very pleasant lady who just recently moved to [facility] from [place] to be closer to family.  General bed mobility comments: applied short KI to L knee for "comfort" and instructed on proper application, use and fit.  Assisted to EOB required much assist and increased time.  Greatest difficulty was scooting to EOB.General transfer comment: Pt required + 2 side by side assist with 50% VC's on forward lean and turn completion 1/4 pivot to recliner.  Pt VERY fearful.  Tolerated WBing L LE well.General Gait Details: pt only took a few pivot steps to recliner + 2 assist for safety.  did not attempt amb due to increased anxiety. Pt will need ST Rehab at SNF   Recommendations for follow up therapy are one component of a multi-disciplinary discharge planning process, led by the attending physician.  Recommendations may be updated based on patient status, additional functional criteria and insurance authorization.  Follow Up Recommendations  Skilled nursing-short term rehab (<3 hours/day)     Assistance Recommended at Discharge Intermittent Supervision/Assistance  Patient can return home with the following A lot of help with walking and/or transfers;A little help with bathing/dressing/bathroom;Assistance with cooking/housework;Assist for transportation;Help with stairs or ramp for entrance   Equipment Recommendations  None recommended by PT    Recommendations for Other Services       Precautions / Restrictions Precautions Precautions:  Fall Required Braces or Orthoses: Knee Immobilizer - Left Knee Immobilizer - Left: On when out of bed or walking Restrictions Weight Bearing Restrictions: No LLE Weight Bearing: Weight bearing as tolerated Other Position/Activity Restrictions: short KI for comfort OOB     Mobility  Bed Mobility Overal bed mobility: Needs Assistance Bed Mobility: Supine to Sit     Supine to sit: Max assist, HOB elevated     General bed mobility comments: applied short KI to L knee for "comfort" and instructed on proper application, use and fit.  Assisted to EOB required much assist and increased time.  Greatest difficulty was scooting to EOB.    Transfers Overall transfer level: Needs assistance Equipment used: Rolling walker (2 wheels)               General transfer comment: Pt required + 2 side by side assist with 50% VC's on forward lean and turn completion 1/4 pivot to recliner.  Pt VERY fearful.  Tolerated WBing L LE well.    Ambulation/Gait               General Gait Details: pt only took a few pivot steps to recliner + 2 assist for safety.  did not attempt amb due to increased anxiety.   Stairs             Wheelchair Mobility    Modified Rankin (Stroke Patients Only)       Balance  Cognition Arousal/Alertness: Awake/alert Behavior During Therapy: WFL for tasks assessed/performed Overall Cognitive Status: Within Functional Limits for tasks assessed                                 General Comments: AxO x 3 very pleasant lady who just recently moved to [facility] from [facility] to be closer to family        Exercises      General Comments        Pertinent Vitals/Pain Pain Assessment Pain Assessment: Faces Faces Pain Scale: Hurts a little bit Pain Location: L posterior thigh and low back Pain Descriptors / Indicators: Grimacing, Sore, Aching, Discomfort Pain Intervention(s):  Monitored during session, Premedicated before session, Repositioned    Home Living                          Prior Function            PT Goals (current goals can now be found in the care plan section) Progress towards PT goals: Progressing toward goals    Frequency    Min 2X/week      PT Plan Current plan remains appropriate    Co-evaluation              AM-PAC PT "6 Clicks" Mobility   Outcome Measure  Help needed turning from your back to your side while in a flat bed without using bedrails?: A Lot Help needed moving from lying on your back to sitting on the side of a flat bed without using bedrails?: A Lot Help needed moving to and from a bed to a chair (including a wheelchair)?: A Lot Help needed standing up from a chair using your arms (e.g., wheelchair or bedside chair)?: Total Help needed to walk in hospital room?: Total Help needed climbing 3-5 steps with a railing? : Total 6 Click Score: 9    End of Session Equipment Utilized During Treatment: Gait belt Activity Tolerance: Patient limited by fatigue;Other (comment) (fear of falling) Patient left: in chair;with call bell/phone within reach;with chair alarm set Nurse Communication: Mobility status;Patient requests pain meds PT Visit Diagnosis: Unsteadiness on feet (R26.81);Muscle weakness (generalized) (M62.81);Pain;Other abnormalities of gait and mobility (R26.89) Pain - Right/Left: Left Pain - part of body: Leg     Time: 1035-1100 PT Time Calculation (min) (ACUTE ONLY): 25 min  Charges:  $Therapeutic Activity: 23-37 mins                     {Raymie Trani  PTA Acute  Rehabilitation Services Pager      [phone] Office      [phone]

## 2021-03-23 NOTE — Plan of Care (Signed)
  Problem: Pain Management: Goal: Pain level will decrease Outcome: Progressing   Problem: Pain Managment: Goal: General experience of comfort will improve Outcome: Progressing   

## 2021-03-24 ENCOUNTER — Other Ambulatory Visit (HOSPITAL_COMMUNITY): Payer: Self-pay

## 2021-03-24 DIAGNOSIS — R8271 Bacteriuria: Secondary | ICD-10-CM | POA: Diagnosis not present

## 2021-03-24 DIAGNOSIS — N3289 Other specified disorders of bladder: Secondary | ICD-10-CM | POA: Diagnosis not present

## 2021-03-24 DIAGNOSIS — S32613A Displaced avulsion fracture of unspecified ischium, initial encounter for closed fracture: Secondary | ICD-10-CM

## 2021-03-24 LAB — CBC WITH DIFFERENTIAL/PLATELET
Abs Immature Granulocytes: 0.04 10*3/uL (ref 0.00–0.07)
Basophils Absolute: 0.1 10*3/uL (ref 0.0–0.1)
Basophils Relative: 1 %
Eosinophils Absolute: 0.2 10*3/uL (ref 0.0–0.5)
Eosinophils Relative: 3 %
HCT: 37 % (ref 36.0–46.0)
Hemoglobin: 12.1 g/dL (ref 12.0–15.0)
Immature Granulocytes: 1 %
Lymphocytes Relative: 37 %
Lymphs Abs: 3 10*3/uL (ref 0.7–4.0)
MCH: 31.7 pg (ref 26.0–34.0)
MCHC: 32.7 g/dL (ref 30.0–36.0)
MCV: 96.9 fL (ref 80.0–100.0)
Monocytes Absolute: 1.1 10*3/uL — ABNORMAL HIGH (ref 0.1–1.0)
Monocytes Relative: 14 %
Neutro Abs: 3.6 10*3/uL (ref 1.7–7.7)
Neutrophils Relative %: 44 %
Platelets: 197 10*3/uL (ref 150–400)
RBC: 3.82 MIL/uL — ABNORMAL LOW (ref 3.87–5.11)
RDW: 13.2 % (ref 11.5–15.5)
WBC: 8 10*3/uL (ref 4.0–10.5)
nRBC: 0 % (ref 0.0–0.2)

## 2021-03-24 LAB — COMPREHENSIVE METABOLIC PANEL
ALT: 9 U/L (ref 0–44)
AST: 18 U/L (ref 15–41)
Albumin: 3 g/dL — ABNORMAL LOW (ref 3.5–5.0)
Alkaline Phosphatase: 71 U/L (ref 38–126)
Anion gap: 8 (ref 5–15)
BUN: 27 mg/dL — ABNORMAL HIGH (ref 8–23)
CO2: 23 mmol/L (ref 22–32)
Calcium: 8.7 mg/dL — ABNORMAL LOW (ref 8.9–10.3)
Chloride: 104 mmol/L (ref 98–111)
Creatinine, Ser: 1.05 mg/dL — ABNORMAL HIGH (ref 0.44–1.00)
GFR, Estimated: 50 mL/min — ABNORMAL LOW (ref >60)
Glucose, Bld: 192 mg/dL — ABNORMAL HIGH (ref 70–99)
Potassium: 4.5 mmol/L (ref 3.5–5.1)
Sodium: 135 mmol/L (ref 135–145)
Total Bilirubin: 0.9 mg/dL (ref 0.3–1.2)
Total Protein: 6.4 g/dL — ABNORMAL LOW (ref 6.5–8.1)

## 2021-03-24 LAB — MAGNESIUM: Magnesium: 1.7 mg/dL (ref 1.7–2.4)

## 2021-03-24 LAB — URINE CULTURE: Culture: >=100000 — AB

## 2021-03-24 LAB — PHOSPHORUS: Phosphorus: 3.9 mg/dL (ref 2.5–4.6)

## 2021-03-24 MED ORDER — HYDROCODONE-ACETAMINOPHEN 5-325 MG PO TABS: 1.0000 | ORAL_TABLET | Freq: Four times a day (QID) | ORAL | 0 refills | Status: AC | PRN

## 2021-03-24 MED ORDER — FOSFOMYCIN TROMETHAMINE 3 G PO PACK
3.0000 g | PACK | Freq: Once | ORAL | Status: AC
  Administered 2021-03-24: 3 g via ORAL
  Filled 2021-03-24: qty 3

## 2021-03-24 MED ORDER — VANCOMYCIN HCL 750 MG/150ML IV SOLN
750.0000 mg | INTRAVENOUS | Status: DC
  Filled 2021-03-24: qty 150

## 2021-03-24 NOTE — Care Management Important Message (Signed)
Important Message  Patient Details IM Letter placed in Patients room. Name: Kim Gonzalez MRN: [id number] Date of Birth: Oct 11, 1928   Medicare Important Message Given:  Yes     Kerin Salen 03/24/2021, 12:23 PM

## 2021-03-24 NOTE — Progress Notes (Signed)
The patient is alert and oriented and has been seen by her physician. The orders for discharge were written. IV has been removed. Went over discharge instructions with patient. She is being discharged to [facility] via [facility] with all of her belongings.

## 2021-03-24 NOTE — Plan of Care (Signed)
  Problem: Pain Managment: Goal: General experience of comfort will improve Outcome: Progressing   

## 2021-03-24 NOTE — Progress Notes (Signed)
Physical Therapy Treatment Patient Details Name: Kim Gonzalez MRN: [id number] DOB: Jun 25, 1928 Today's Date: 03/24/2021   History of Present Illness Pt is a 88 y.o. female who presents following recurrent falls at home.  Found to have acute avulsion fx of L ischial tuberosity and non complete non retracted tears of L hamstring tendon origin. PMH significant for hx afib s/p watchman, CKD, diabetes, and hypothyroidism.    PT Comments    Pt lives home alone and admits to sleeping/eating/living in her lift chair. General Comments: AxO x 3 very pleasant lady who just recently moved to [facility] from [place] to be closer to family. Assisted OOB to attempt amb was difficult.  General bed mobility comments: applied short KI to L knee for "comfort" and instructed on proper application, use and fit.  Assisted to EOB required much assist and increased time.  Greatest difficulty was scooting to EOB.General transfer comment: Pt required + 2 side by side assist with 50% VC's on forward lean and turn completion 1/4 pivot to recliner.  Pt VERY fearful.  Tolerated WBing L LE well.General Gait Details: Pt VERY fearful and required + 2 assist one on each side then a third assist to follow with recliner.  Pt is able to support her weight in stance but then present with increased difficulty weigh shifting onto L side due to increased posterior hip pain.  Pt was able to take a few steps forward for a total of 3 feet. Pt will need ST Rehab at SNF prior to returning home.   Recommendations for follow up therapy are one component of a multi-disciplinary discharge planning process, led by the attending physician.  Recommendations may be updated based on patient status, additional functional criteria and insurance authorization.  Follow Up Recommendations  Skilled nursing-short term rehab (<3 hours/day)     Assistance Recommended at Discharge Intermittent Supervision/Assistance  Patient can return home with the following A  lot of help with walking and/or transfers;A little help with bathing/dressing/bathroom;Assistance with cooking/housework;Assist for transportation;Help with stairs or ramp for entrance   Equipment Recommendations  None recommended by PT    Recommendations for Other Services       Precautions / Restrictions Precautions Precautions: Fall Required Braces or Orthoses: Knee Immobilizer - Left Knee Immobilizer - Left: On when out of bed or walking Restrictions Weight Bearing Restrictions: No LLE Weight Bearing: Weight bearing as tolerated Other Position/Activity Restrictions: short KI for comfort OOB "bad knee"     Mobility  Bed Mobility Overal bed mobility: Needs Assistance Bed Mobility: Supine to Sit     Supine to sit: Max assist, HOB elevated     General bed mobility comments: applied short KI to L knee for "comfort" and instructed on proper application, use and fit.  Assisted to EOB required much assist and increased time.  Greatest difficulty was scooting to EOB.    Transfers Overall transfer level: Needs assistance Equipment used: Rolling walker (2 wheels)               General transfer comment: Pt required + 2 side by side assist with 50% VC's on forward lean and turn completion 1/4 pivot to recliner.  Pt VERY fearful.  Tolerated WBing L LE well.    Ambulation/Gait Ambulation/Gait assistance: Max assist Gait Distance (Feet): 3 Feet Assistive device: Rolling walker (2 wheels) Gait Pattern/deviations: Step-to pattern, Decreased stance time - left       General Gait Details: Pt VERY fearful and required + 2 assist one on  each side then a third assist to follow with recliner.  Pt is able to support her weight in stance but then present with increased difficulty weigh shifting onto L side due to increased posterior hip pain.  Pt was able to take a few steps forward for a total of 3 feet.   Stairs             Wheelchair Mobility    Modified Rankin (Stroke  Patients Only)       Balance                                            Cognition Arousal/Alertness: Awake/alert Behavior During Therapy: WFL for tasks assessed/performed Overall Cognitive Status: Within Functional Limits for tasks assessed                                 General Comments: AxO x 3 very pleasant lady who just recently moved to [facility] from [facility] to be closer to family        Exercises      General Comments        Pertinent Vitals/Pain Pain Assessment Pain Assessment: Faces Faces Pain Scale: Hurts little more Pain Location: L posterior thigh and low back Pain Descriptors / Indicators: Grimacing, Sore, Aching, Discomfort Pain Intervention(s): Monitored during session, Premedicated before session, Repositioned    Home Living                          Prior Function            PT Goals (current goals can now be found in the care plan section) Progress towards PT goals: Progressing toward goals    Frequency    Min 2X/week      PT Plan Current plan remains appropriate    Co-evaluation              AM-PAC PT "6 Clicks" Mobility   Outcome Measure  Help needed turning from your back to your side while in a flat bed without using bedrails?: A Lot Help needed moving from lying on your back to sitting on the side of a flat bed without using bedrails?: A Lot Help needed moving to and from a bed to a chair (including a wheelchair)?: Total Help needed standing up from a chair using your arms (e.g., wheelchair or bedside chair)?: Total Help needed to walk in hospital room?: Total Help needed climbing 3-5 steps with a railing? : Total 6 Click Score: 8    End of Session Equipment Utilized During Treatment: Gait belt Activity Tolerance: Patient limited by fatigue;Other (comment) Patient left: in chair;with call bell/phone within reach;with chair alarm set Nurse Communication: Mobility status;Patient  requests pain meds PT Visit Diagnosis: Unsteadiness on feet (R26.81);Muscle weakness (generalized) (M62.81);Pain;Other abnormalities of gait and mobility (R26.89) Pain - Right/Left: Left Pain - part of body: Leg     Time: 1005-1029 PT Time Calculation (min) (ACUTE ONLY): 24 min  Charges:  $Gait Training: 8-22 mins $Therapeutic Activity: 8-22 mins                     Rica Koyanagi  PTA Acute  Rehabilitation Services Pager      [phone] Office      [phone]

## 2021-03-24 NOTE — TOC Benefit Eligibility Note (Signed)
Patient Product/process development scientist completed.    The patient is currently admitted and upon discharge could be taking linezolid (Zyvox) 600 mg tablets.  The current 10 day co-pay is, $10.00.   The patient is insured through [employer]     Roland Earl, CPhT Pharmacy Patient Advocate Specialist [facility] Patient Advocate Team Direct Number: [phone]  Fax: [phone]

## 2021-03-24 NOTE — Discharge Summary (Signed)
Physician Discharge Summary  Kim Gonzalez [id number] DOB: June 10, 1928 DOA: 03/19/2021  PCP: Kim Huxley, PA  Admit date: 03/19/2021 Discharge date: 03/24/2021  Time spent: 40 minutes  Recommendations for Outpatient Follow-up:  Follow outpatient CBC/CMP Follow UTI symptoms - treated inpatient, grew MRSA and enterococcus, suspect contaminant as came off purwick, she was treated inpatient, would follow for symptoms outpatient and work up as apporpiate  Blood cultures were no growth at discharge.   Follow kidney function, volume status, lytes outpatient   Follow with orthopedics outpatient   Discharge Diagnoses:  Principal Problem:   Avulsion fracture of ischial tuberosity (Kim Gonzalez) Active Problems:   Fall   Acute lower UTI   Chronic pain   Hypothyroidism   Essential hypertension   Overactive bladder   Atrial fibrillation (HCC)   Presence of Watchman left atrial appendage closure device   Diabetes (HCC)   Elevated serum creatinine   Discharge Condition: stable  Diet recommendation: heart healthy, diabetic  Filed Weights   03/22/21 0500 03/23/21 0558 03/24/21 0432  Weight: 100.7 kg 102 kg 104.4 kg    History of present illness:  88 yo with hx afib s/p watchman, CKD, diabetes, hypothyroidism, and multiple other medical problems who presents 7 days after recurrent falls.  She's found to have left ischial tuberosity avulsion fracture and near complete non retracted tears of the L hamstring tendon origin.  Non operative per ortho.  Hospitalization complicated by dysuria and urine positive for UTI, she's been treated with antibiotics.  Plan for SNF, likely 1/31.  See below for additional details  Hospital Course:  Assessment and Plan: * Avulsion fracture of ischial tuberosity (Kim Gonzalez)- (present on admission) Fall with acute avulsion fx of L ischial tuberosity with 4x1.2 cm minimally displaced fx fragment Also with non complete non retracted tears of L hamstring tendon  origin Orthopedic recommending pain control and WBAT, outpatient follow up - can use knee immobilizer when OOB if that helps Will admit for pain management, therapy, and placement if needed Scheduled APAP, prn norco, scheduled voltaren, lidocaine patch Therapy recommending SNF - will discharge to SNF   Fall- (present on admission) At baseline, wheelchair bound, can transfer Kim Gonzalez when transferring to toilet PT, follow  Acute lower UTI Dysuria UA concerning for UTI Urine culture with staph aureus and enterococcus faecalis - MRSA and e faecalis sensitive to amoxicllin Urine was taken from Kim Gonzalez, also she has chronic dysuria - ? Contaminants Given staph aureus in urine, follow blood cultures - blood cultures so far no growth, but as above, given collection method and her chronic symptoms, not sure these represent true infections Appreciate ID recommendations -> fosfomycin x1 today, then follow outpatient with PCP to follow symptoms  Hypothyroidism Synthroid   Chronic pain Gabapentin, cymbalta   Essential hypertension Continue metoprolol Resume spironolactone and torsemide - follow volume status/lytes outaptient  Overactive bladder myrbetriq   Presence of Watchman left atrial appendage closure device- (present on admission) 2019, s/p watchman Continue beta blocker  Atrial fibrillation ([place])- (present on admission) S/p watchman, beta blocker  Elevated serum creatinine Baseline appears to be around 1.29 follow  Diabetes ([place]) No longer taking any meds, follow a1c 6.3   Procedures: none   Consultations: ID  Discharge Exam: Vitals:   03/23/21 2209 03/24/21 0432  BP: 115/77 (!) 141/76  Pulse: 95 93  Resp: 15 20  Temp: 98 F (36.7 C) 98 F (36.7 C)  SpO2: 97% 96%   No complaints  General: No acute distress. Cardiovascular: RRR  Lungs: labored after getting to chair, this resolves with time, no clear adventitious lung sounds Abdomen: Soft,  nontender, nondistended  Neurological: Alert and oriented 3. Moves all extremities 4. Cranial nerves II through XII grossly intact. Skin: Warm and dry. No rashes or lesions. Extremities: L knee in immobilizer  Discharge Instructions   Discharge Instructions     Call MD for:  difficulty breathing, headache or visual disturbances   Complete by: As directed    Call MD for:  extreme fatigue   Complete by: As directed    Call MD for:  hives   Complete by: As directed    Call MD for:  persistant dizziness or light-headedness   Complete by: As directed    Call MD for:  persistant nausea and vomiting   Complete by: As directed    Call MD for:  redness, tenderness, or signs of infection (pain, swelling, redness, odor or green/yellow discharge around incision site)   Complete by: As directed    Call MD for:  severe uncontrolled pain   Complete by: As directed    Call MD for:  temperature >100.4   Complete by: As directed    Diet - low sodium heart healthy   Complete by: As directed    Discharge instructions   Complete by: As directed    You were seen after Kim Gonzalez fall with pelvic fracture and Kim Gonzalez tear of the left hamstring tendon.  This is Kim Gonzalez non operative injury.  You can follow with orthopedics outpatient.  You can weight bear as tolerated and use the knee immobilizer if it helps.  We treated you for Kim Gonzalez UTI.  It's not clear that the bacteria that grew represents Kim Gonzalez true infection.  Follow with your outpatient doctors at the SNF to follow up your symptoms and determine whether repeat cultures are necessary.  Return for new, recurrent, or worsening symptoms.  Please ask your PCP to request records from this hospitalization so they know what was done and what the next steps will be.   Increase activity slowly   Complete by: As directed       Allergies as of 03/24/2021       Reactions   Amoxicillin Diarrhea   Aspirin    unknown   Ciprofibrate    unknown   Dilantin [phenytoin]     unknown   Doxycycline Hives   Erythromycin    Other reaction(s): UPSET STOMACH   Fentanyl    Other reaction(s): altered mental states   Ibuprofen    Upset stomach    Lovastatin    unknown   Macrodantin [nitrofurantoin]    unknown   Oxycodone    Other reaction(s): unbalanced   Oxycodone-acetaminophen    Upset stomach    Sulfamethoxazole-trimethoprim    Stomach pain        Medication List     STOP taking these medications    cephALEXin 500 MG capsule Commonly known as: KEFLEX   doxycycline 100 MG tablet Commonly known as: VIBRA-TABS   levofloxacin 500 MG tablet Commonly known as: LEVAQUIN       TAKE these medications    Align 4 MG Caps Take one capsule by mouth daily. What changed:  how much to take how to take this when to take this   buPROPion 300 MG 24 hr tablet Commonly known as: WELLBUTRIN XL Take 1 tablet by mouth once daily in the morning. What changed:  how much to take when to take this   DULoxetine  60 MG capsule Commonly known as: CYMBALTA Take 1 capsule by mouth once daily. What changed:  how much to take when to take this   gabapentin 300 MG capsule Commonly known as: NEURONTIN Take 1 capsule by mouth once daily at bedtime. What changed:  how much to take how to take this when to take this   HYDROcodone-acetaminophen 5-325 MG tablet Commonly known as: NORCO/VICODIN Take 1 tablet by mouth every 6 (six) hours as needed for up to 3 days for moderate pain.   levothyroxine 88 MCG tablet Commonly known as: SYNTHROID Take 1 tablet by mouth in the morning on an empty stomach   metoprolol succinate 25 MG 24 hr tablet Commonly known as: TOPROL-XL Take 1 tablet (25 mg total) by mouth daily.   Myrbetriq 50 MG Tb24 tablet Generic drug: mirabegron ER Take 1 tablet (50 mg total) by mouth daily.   potassium chloride 10 MEQ tablet Commonly known as: KLOR-CON Take 1 tablet by mouth once daily with food   rosuvastatin 20 MG  tablet Commonly known as: CRESTOR Take 1 tablet (20 mg total) by mouth daily.   spironolactone 25 MG tablet Commonly known as: ALDACTONE Take 1 tablet (25 mg total) by mouth daily.   torsemide 10 MG tablet Commonly known as: DEMADEX Take 1 tablet (10 mg total) by mouth daily.       Allergies  Allergen Reactions   Amoxicillin Diarrhea   Aspirin     unknown   Ciprofibrate     unknown   Dilantin [Phenytoin]     unknown   Doxycycline Hives   Erythromycin     Other reaction(s): UPSET STOMACH   Fentanyl     Other reaction(s): altered mental states   Ibuprofen     Upset stomach    Lovastatin     unknown   Macrodantin [Nitrofurantoin]     unknown   Oxycodone     Other reaction(s): unbalanced   Oxycodone-Acetaminophen     Upset stomach    Sulfamethoxazole-Trimethoprim     Stomach pain    Contact information for follow-up providers     Kim Huxley, PA Follow up.   Specialty: Family Medicine Contact information: [place] [address] [phone]         Shona Needles, MD Follow up.   Specialty: Orthopedic Surgery Why: Call for follow up Contact information: [place] [address] [phone]              Contact information for after-discharge care     Destination     [facility] Preferred SNF .   Service: Skilled Nursing Contact information: [address] [place] [phone]                      The results of significant diagnostics from this hospitalization (including imaging, microbiology, ancillary and laboratory) are listed below for reference.    Significant Diagnostic Studies: MR HIP LEFT WO CONTRAST  Result Date: 03/19/2021 CLINICAL DATA:  Left hip pain.  Fall 5 days ago EXAM: MR OF THE LEFT HIP WITHOUT CONTRAST TECHNIQUE: Multiplanar, multisequence MR imaging was performed. No intravenous contrast was administered. COMPARISON:  X-ray  03/19/2021 FINDINGS: Bones: Acute avulsion fracture of the left ischial tuberosity with an approximately 4.0 x 1.2 cm minimally displaced fracture fragment (series 8, image 15). Marked bone marrow edema within the left ischium. Near-complete non-retracted tears of the left hamstring  tendon origin. No additional fracture. No dislocation. No femoral head avascular necrosis. No pelvic diastasis. Degenerative disc disease of the visualized lumbar spine. No marrow replacing bone lesion. Articular cartilage and labrum Articular cartilage:  Mild chondral thinning. Labrum:  Grossly intact on non-arthrographic imaging. Joint or bursal effusion Joint effusion:  None. Bursae: No abnormal bursal fluid collection. Muscles and tendons Muscles and tendons: Near complete left hamstring avulsion. The gluteal, iliopsoas, rectus femoris, and adductor tendons appear intact. Generalized muscle atrophy. Fatty infiltration of the bilateral gluteal musculature. Other findings Miscellaneous: Small ill-defined hematoma at the fracture site. No inguinal lymphadenopathy. Scattered diverticular changes within the visualized sigmoid colon. IMPRESSION: 1. Acute avulsion fracture of the left ischial tuberosity with an approximately 4.0 x 1.2 cm minimally displaced fracture fragment. 2. Near-complete non-retracted tears of the left hamstring tendon origin. Electronically Signed   By: Duanne GuessNicholas  Plundo D.O.   On: 03/19/2021 15:20   DG Hip Unilat With Pelvis 2-3 Views Left  Result Date: 03/19/2021 CLINICAL DATA:  Status post fall, left hip pain EXAM: DG HIP (WITH OR WITHOUT PELVIS) 2-3V LEFT COMPARISON:  None. FINDINGS: No acute fracture or dislocation. No aggressive osseous lesion. Normal alignment. Lower lumbar spine spondylosis Soft tissue are unremarkable. No radiopaque foreign body or soft tissue emphysema. IMPRESSION: No acute osseous injury of the left hip. Electronically Signed   By: Elige KoHetal  Patel M.D.   On: 03/19/2021 12:47     Microbiology: Recent Results (from the past 240 hour(s))  Resp Panel by RT-PCR (Flu Kiernan Atkerson&B, Covid) Nasopharyngeal Swab     Status: None   Collection Time: 03/19/21  9:40 PM   Specimen: Nasopharyngeal Swab; Nasopharyngeal(NP) swabs in vial transport medium  Result Value Ref Range Status   SARS Coronavirus 2 by RT PCR NEGATIVE NEGATIVE Final    Comment: (NOTE) SARS-CoV-2 target nucleic acids are NOT DETECTED.  The SARS-CoV-2 RNA is generally detectable in upper respiratory specimens during the acute phase of infection. The lowest concentration of SARS-CoV-2 viral copies this assay can detect is 138 copies/mL. Takera Rayl negative result does not preclude SARS-Cov-2 infection and should not be used as the sole basis for treatment or other patient management decisions. Cordai Rodrigue negative result may occur with  improper specimen collection/handling, submission of specimen other than nasopharyngeal swab, presence of viral mutation(s) within the areas targeted by this assay, and inadequate number of viral copies(<138 copies/mL). Shimon Trowbridge negative result must be combined with clinical observations, patient history, and epidemiological information. The expected result is Negative.  Fact Sheet for Patients:  BloggerCourse.comhttps://www.fda.gov/media/152166/download  Fact Sheet for Healthcare Providers:  SeriousBroker.ithttps://www.fda.gov/media/152162/download  This test is no t yet approved or cleared by the [place] FDA and  has been authorized for detection and/or diagnosis of SARS-CoV-2 by FDA under an Emergency Use Authorization (EUA). This EUA will remain  in effect (meaning this test can be used) for the duration of the COVID-19 declaration under Section 564(b)(1) of the Act, 21 U.S.C.section 360bbb-3(b)(1), unless the authorization is terminated  or revoked sooner.       Influenza Ariea Rochin by PCR NEGATIVE NEGATIVE Final   Influenza B by PCR NEGATIVE NEGATIVE Final    Comment: (NOTE) The Xpert Xpress SARS-CoV-2/FLU/RSV plus assay is  intended as an aid in the diagnosis of influenza from Nasopharyngeal swab specimens and should not be used as Kyrus Hyde sole basis for treatment. Nasal washings and aspirates are unacceptable for Xpert Xpress SARS-CoV-2/FLU/RSV testing.  Fact Sheet for Patients: BloggerCourse.comhttps://www.fda.gov/media/152166/download  Fact Sheet for Healthcare Providers: SeriousBroker.ithttps://www.fda.gov/media/152162/download  This test  is not yet approved or cleared by the [place] and has been authorized for detection and/or diagnosis of SARS-CoV-2 by FDA under an Emergency Use Authorization (EUA). This EUA will remain in effect (meaning this test can be used) for the duration of the COVID-19 declaration under Section 564(b)(1) of the Act, 21 U.S.C. section 360bbb-3(b)(1), unless the authorization is terminated or revoked.  Performed at [facility], [address], [place], [place] [address]   Urine Culture     Status: Abnormal   Collection Time: 03/20/21  5:50 PM   Specimen: Urine, Clean Catch  Result Value Ref Range Status   Specimen Description   Final    URINE, CLEAN CATCH Performed at [facility], [address] [place], [place] [address]    Special Requests   Final    NONE Performed at [facility], [address] [place], [place] [address]    Culture (Easton Sivertson)  Final    >=100,000 COLONIES/mL METHICILLIN RESISTANT STAPHYLOCOCCUS AUREUS 50,000 COLONIES/mL ENTEROCOCCUS FAECALIS    Report Status 03/24/2021 FINAL  Final   Organism ID, Bacteria METHICILLIN RESISTANT STAPHYLOCOCCUS AUREUS (Presley Gora)  Final   Organism ID, Bacteria ENTEROCOCCUS FAECALIS (Delano Frate)  Final      Susceptibility   Enterococcus faecalis - MIC*    AMPICILLIN <=2 SENSITIVE Sensitive     NITROFURANTOIN <=16 SENSITIVE Sensitive     VANCOMYCIN 1 SENSITIVE Sensitive     * 50,000 COLONIES/mL ENTEROCOCCUS FAECALIS   Methicillin resistant staphylococcus aureus - MIC*    CIPROFLOXACIN >=8 RESISTANT  Resistant     GENTAMICIN <=0.5 SENSITIVE Sensitive     NITROFURANTOIN <=16 SENSITIVE Sensitive     OXACILLIN >=4 RESISTANT Resistant     TETRACYCLINE >=16 RESISTANT Resistant     VANCOMYCIN <=0.5 SENSITIVE Sensitive     TRIMETH/SULFA >=320 RESISTANT Resistant     CLINDAMYCIN RESISTANT Resistant     RIFAMPIN <=0.5 SENSITIVE Sensitive     Inducible Clindamycin POSITIVE Resistant     * >=100,000 COLONIES/mL METHICILLIN RESISTANT STAPHYLOCOCCUS AUREUS  Culture, blood (routine x 2)     Status: None (Preliminary result)   Collection Time: 03/22/21  1:48 PM   Specimen: BLOOD  Result Value Ref Range Status   Specimen Description   Final    BLOOD BLOOD LEFT FOREARM Performed at [facility], [address], [place], [place] [address]    Special Requests   Final    BOTTLES DRAWN AEROBIC AND ANAEROBIC Blood Culture adequate volume Performed at [facility], [address] [place], [place] [address]    Culture   Final    NO GROWTH 2 DAYS Performed at [facility], [address] [place], [place] [address]    Report Status PENDING  Incomplete  Culture, blood (routine x 2)     Status: None (Preliminary result)   Collection Time: 03/22/21  1:48 PM   Specimen: BLOOD  Result Value Ref Range Status   Specimen Description   Final    BLOOD RIGHT ANTECUBITAL Performed at [facility], [address] [place], [place] [address]    Special Requests   Final    BOTTLES DRAWN AEROBIC AND ANAEROBIC Blood Culture adequate volume Performed at [facility], [address] [place], [place] [address]    Culture   Final    NO GROWTH 2 DAYS Performed at [facility], [address] [place], [place] [address]  Report Status PENDING  Incomplete  Resp Panel by RT-PCR (Flu Raianna Slight&B, Covid) Nasopharyngeal Swab     Status: None   Collection Time: 03/23/21  6:10 PM   Specimen: Nasopharyngeal Swab; Nasopharyngeal(NP) swabs  in vial transport medium  Result Value Ref Range Status   SARS Coronavirus 2 by RT PCR NEGATIVE NEGATIVE Final    Comment: (NOTE) SARS-CoV-2 target nucleic acids are NOT DETECTED.  The SARS-CoV-2 RNA is generally detectable in upper respiratory specimens during the acute phase of infection. The lowest concentration of SARS-CoV-2 viral copies this assay can detect is 138 copies/mL. Tavonte Seybold negative result does not preclude SARS-Cov-2 infection and should not be used as the sole basis for treatment or other patient management decisions. Jakota Manthei negative result may occur with  improper specimen collection/handling, submission of specimen other than nasopharyngeal swab, presence of viral mutation(s) within the areas targeted by this assay, and inadequate number of viral copies(<138 copies/mL). Koki Buxton negative result must be combined with clinical observations, patient history, and epidemiological information. The expected result is Negative.  Fact Sheet for Patients:  EntrepreneurPulse.com.au  Fact Sheet for Healthcare Providers:  IncredibleEmployment.be  This test is no t yet approved or cleared by the [place] FDA and  has been authorized for detection and/or diagnosis of SARS-CoV-2 by FDA under an Emergency Use Authorization (EUA). This EUA will remain  in effect (meaning this test can be used) for the duration of the COVID-19 declaration under Section 564(b)(1) of the Act, 21 U.S.C.section 360bbb-3(b)(1), unless the authorization is terminated  or revoked sooner.       Influenza Zettie Gootee by PCR NEGATIVE NEGATIVE Final   Influenza B by PCR NEGATIVE NEGATIVE Final    Comment: (NOTE) The Xpert Xpress SARS-CoV-2/FLU/RSV plus assay is intended as an aid in the diagnosis of influenza from Nasopharyngeal swab specimens and should not be used as Tiyana Galla sole basis for treatment. Nasal washings and aspirates are unacceptable for Xpert Xpress  SARS-CoV-2/FLU/RSV testing.  Fact Sheet for Patients: EntrepreneurPulse.com.au  Fact Sheet for Healthcare Providers: IncredibleEmployment.be  This test is not yet approved or cleared by the [place] FDA and has been authorized for detection and/or diagnosis of SARS-CoV-2 by FDA under an Emergency Use Authorization (EUA). This EUA will remain in effect (meaning this test can be used) for the duration of the COVID-19 declaration under Section 564(b)(1) of the Act, 21 U.S.C. section 360bbb-3(b)(1), unless the authorization is terminated or revoked.  Performed at [facility], [address] [place], [place] [address]      Labs: Basic Metabolic Panel: Recent Labs  Lab 03/19/21 1820 03/20/21 0327 03/23/21 0324 03/24/21 0303  NA 135 136 136 135  K 4.3 4.1 4.3 4.5  CL 101 103 103 104  CO2 25 25 25 23   GLUCOSE 125* 156* 144* 192*  BUN 28* 33* 22 27*  CREATININE 1.28*   1.26* 1.29* 1.40* 1.05*  CALCIUM 9.3 8.9 8.5* 8.7*  MG  --   --  1.8 1.7  PHOS  --   --  4.2 3.9   Liver Function Tests: Recent Labs  Lab 03/20/21 0327 03/23/21 0324 03/24/21 0303  AST 12* 16 18  ALT 10 10 9   ALKPHOS 67 70 71  BILITOT 0.8 0.7 0.9  PROT 6.7 6.4* 6.4*  ALBUMIN 3.4* 3.1* 3.0*   No results for input(s): LIPASE, AMYLASE in the last 168 hours. No results for input(s): AMMONIA in the last 168 hours. CBC: Recent Labs  Lab 03/19/21 1820 03/20/21 0327  03/23/21 0324 03/24/21 0303  WBC 8.0 8.1 6.6 8.0  NEUTROABS  --   --  2.7 3.6  HGB 13.3 12.7 12.1 12.1  HCT 41.3 39.8 38.6 37.0  MCV 96.0 97.1 99.5 96.9  PLT 212 205 200 197   Cardiac Enzymes: No results for input(s): CKTOTAL, CKMB, CKMBINDEX, TROPONINI in the last 168 hours. BNP: BNP (last 3 results) No results for input(s): BNP in the last 8760 hours.  ProBNP (last 3 results) No results for input(s): PROBNP in the last 8760 hours.  CBG: No results for input(s):  GLUCAP in the last 168 hours.     Signed:  Fayrene Helper MD.  [facility] 03/24/2021, 1:37 PM

## 2021-03-24 NOTE — Progress Notes (Signed)
Called report to Myshia Turner, LPN at [facility].

## 2021-03-24 NOTE — TOC Transition Note (Signed)
Transition of Care [facility]) - CM/SW Discharge Note   Patient Details  Name: Kim Gonzalez MRN: [id number] Date of Birth: Jul 14, 1928  [facility]) CM/SW Contact:  Amada Jupiter, LCSW Phone Number: 03/24/2021, 3:09 PM   Clinical Narrative:    Pt medically cleared for dc today to SNF. Bed accepted at [facility].  Pt and son aware and agreeable.  PTAR called at 2:33pm. RN to call report to [phone]. No further TOC needs.    Final next level of care: Skilled Nursing Facility Barriers to Discharge: Barriers Resolved   Patient Goals and CMS Choice Patient states their goals for this hospitalization and ongoing recovery are:: Go to rehab CMS Medicare.gov Compare Post Acute Care list provided to:: Patient Choice offered to / list presented to : Patient  Discharge Placement              Patient chooses bed at: [facility] Patient to be transferred to facility by: PTAR Name of family member notified: son, Terissa Haffey Patient and family notified of of transfer: 03/24/21  Discharge Plan and Services In-house Referral: Clinical Social Work   Post Acute Care Choice: Skilled Nursing Facility          DME Arranged: N/A DME Agency: NA                  Social Determinants of Health (SDOH) Interventions     Readmission Risk Interventions No flowsheet data found.

## 2021-03-24 NOTE — Consult Note (Signed)
[facility]    Date of Admission:  03/19/2021     Reason for Consult: mrsa in urine    Referring Provider: Florene Glen       Abx: 1/29-c vanc  1/28-29 ceftriaxone       Assessment: 88 yo female hx pAf s/p watchman 2019, dm2, ckd, here after a mechanical fall with left ischial avulsion fx, chronic dysuria/urgency/frequency now with mrsa/enterococcus in urine  She had external foley and there is concern urine was obtained from the bag rather than fresh urine. Bcx negative (prior to abx given).   Has had multiple abx courses the last few months which don't affect her sx   At this time appearing to be catheter colonization with bacteria or at most assymptomatic bacteriuria.  She has had several days of vanc. I don't see indication to treat, but if team feel strong need to finish course would give one more dose fosfamycin     Plan: Low suspicion for occult bacteremia with mrsa -- I do not feel need to get echo at this time or commit to 2 weeks mrsa bsi treatment One more dose of fosfamycin would be sufficient for a uti treatment F/u pcp 1-2 week after discharge Ischial fx management per ortho Discussed with primary team ID will sign off     ------------------------------------------------ Principal Problem:   Avulsion fracture of ischial tuberosity ([place]) Active Problems:   Atrial fibrillation ([place])   Presence of Watchman left atrial appendage closure device   Essential hypertension   Fall   Chronic pain   Hypothyroidism   Overactive bladder   Diabetes ([place])   Elevated serum creatinine   Acute lower UTI    HPI: Kim Gonzalez is a 88 y.o. female recurrent/chronic dysuria/urgency/frequency, admitted after a glf, then with urine from ?catheter bag showing mrsa/enterococcus  Patient without sepsis this admission or prior to admission Has had at least since 07/2020 sx of "uti" mentioned above Had a mechanical fall and admitted due  to left buttock pain -- mri with ischial avulsion fx  Due to bladder irritation complaint had urine tested here and showed mrsa/enterococcus. Question urine was obtained from catheter bag  Stable wbc without leukocytosis Again no fc On ctrx --> vanc  No flank pain Has had several abx courses since November. Remember "break out" in rash with doxy given a few weeks ago  Fam hx: No urinary stone  Social History   Tobacco Use   Smoking status: Never   Smokeless tobacco: Never  Substance Use Topics   Alcohol use: Never   Drug use: Never    Allergies  Allergen Reactions   Amoxicillin Diarrhea   Aspirin     unknown   Ciprofibrate     unknown   Dilantin [Phenytoin]     unknown   Doxycycline Hives   Erythromycin     Other reaction(s): UPSET STOMACH   Fentanyl     Other reaction(s): altered mental states   Ibuprofen     Upset stomach    Lovastatin     unknown   Macrodantin [Nitrofurantoin]     unknown   Oxycodone     Other reaction(s): unbalanced   Oxycodone-Acetaminophen     Upset stomach    Sulfamethoxazole-Trimethoprim     Stomach pain    Review of Systems: ROS All Other ROS was negative, except mentioned above   Past Medical History:  Diagnosis Date   Atrial fibrillation ([place])  CKD (chronic kidney disease), symptom management only, stage 3 (moderate) (HCC)    Diabetes (H[facility])    Hyperlipidemia    Hypothyroidism    Presence of Watchman left atrial appendage closure device        Scheduled Meds:  buPROPion  300 mg Oral Daily   diclofenac Sodium  4 g Topical QID   DULoxetine  60 mg Oral Daily   enoxaparin (LOVENOX) injection  40 mg Subcutaneous Q24H   fosfomycin  3 g Oral Once   gabapentin  300 mg Oral QHS   levothyroxine  88 mcg Oral QAC breakfast   lidocaine  1 patch Transdermal Q24H   metoprolol succinate  25 mg Oral Daily   mirabegron ER  50 mg Oral Daily   rosuvastatin  20 mg Oral Daily   Continuous Infusions:  vancomycin     PRN  Meds:.[EXPIRED] acetaminophen **FOLLOWED BY** acetaminophen, HYDROcodone-acetaminophen, polyethylene glycol   OBJECTIVE: Blood pressure (!) 117/59, pulse 80, temperature 98.7 F (37.1 C), resp. rate 15, height 5' 3.5" (1.613 m), weight 104.4 kg, SpO2 100 %.  Physical Exam  General/constitutional: no distress, pleasant HEENT: Normocephalic, PER, Conj Clear, EOMI, Oropharynx clear Neck supple CV: rrr no mrg Lungs: clear to auscultation, normal respiratory effort Abd: Soft, Nontender Ext: no edema Skin: No Rash Neuro: nonfocal MSK: strength testing limited left LE due to left ischial pain; feet strength appear symmetric bilaterally  Lab Results Lab Results  Component Value Date   WBC 8.0 03/24/2021   HGB 12.1 03/24/2021   HCT 37.0 03/24/2021   MCV 96.9 03/24/2021   PLT 197 03/24/2021    Lab Results  Component Value Date   CREATININE 1.05 (H) 03/24/2021   BUN 27 (H) 03/24/2021   NA 135 03/24/2021   K 4.5 03/24/2021   CL 104 03/24/2021   CO2 23 03/24/2021    Lab Results  Component Value Date   ALT 9 03/24/2021   AST 18 03/24/2021   ALKPHOS 71 03/24/2021   BILITOT 0.9 03/24/2021      Microbiology: Recent Results (from the past 240 hour(s))  Resp Panel by RT-PCR (Flu A&B, Covid) Nasopharyngeal Swab     Status: None   Collection Time: 03/19/21  9:40 PM   Specimen: Nasopharyngeal Swab; Nasopharyngeal(NP) swabs in vial transport medium  Result Value Ref Range Status   SARS Coronavirus 2 by RT PCR NEGATIVE NEGATIVE Final    Comment: (NOTE) SARS-CoV-2 target nucleic acids are NOT DETECTED.  The SARS-CoV-2 RNA is generally detectable in upper respiratory specimens during the acute phase of infection. The lowest concentration of SARS-CoV-2 viral copies this assay can detect is 138 copies/mL. A negative result does not preclude SARS-Cov-2 infection and should not be used as the sole basis for treatment or other patient management decisions. A negative result may occur  with  improper specimen collection/handling, submission of specimen other than nasopharyngeal swab, presence of viral mutation(s) within the areas targeted by this assay, and inadequate number of viral copies(<138 copies/mL). A negative result must be combined with clinical observations, patient history, and epidemiological information. The expected result is Negative.  Fact Sheet for Patients:  EntrepreneurPulse.com.au  Fact Sheet for Healthcare Providers:  IncredibleEmployment.be  This test is no t yet approved or cleared by the [place] FDA and  has been authorized for detection and/or diagnosis of SARS-CoV-2 by FDA under an Emergency Use Authorization (EUA). This EUA will remain  in effect (meaning this test can be used) for the duration of the COVID-19  declaration under Section 564(b)(1) of the Act, 21 U.S.C.section 360bbb-3(b)(1), unless the authorization is terminated  or revoked sooner.       Influenza A by PCR NEGATIVE NEGATIVE Final   Influenza B by PCR NEGATIVE NEGATIVE Final    Comment: (NOTE) The Xpert Xpress SARS-CoV-2/FLU/RSV plus assay is intended as an aid in the diagnosis of influenza from Nasopharyngeal swab specimens and should not be used as a sole basis for treatment. Nasal washings and aspirates are unacceptable for Xpert Xpress SARS-CoV-2/FLU/RSV testing.  Fact Sheet for Patients: BloggerCourse.com  Fact Sheet for Healthcare Providers: SeriousBroker.it  This test is not yet approved or cleared by the [place] FDA and has been authorized for detection and/or diagnosis of SARS-CoV-2 by FDA under an Emergency Use Authorization (EUA). This EUA will remain in effect (meaning this test can be used) for the duration of the COVID-19 declaration under Section 564(b)(1) of the Act, 21 U.S.C. section 360bbb-3(b)(1), unless the authorization is terminated  or revoked.  Performed at [facility], [address], [place], [place] [address]   Urine Culture     Status: Abnormal   Collection Time: 03/20/21  5:50 PM   Specimen: Urine, Clean Catch  Result Value Ref Range Status   Specimen Description   Final    URINE, CLEAN CATCH Performed at [facility], [address] [place], [place] [address]    Special Requests   Final    NONE Performed at [facility], [address] [place], [place] [address]    Culture (A)  Final    >=100,000 COLONIES/mL METHICILLIN RESISTANT STAPHYLOCOCCUS AUREUS 50,000 COLONIES/mL ENTEROCOCCUS FAECALIS    Report Status 03/24/2021 FINAL  Final   Organism ID, Bacteria METHICILLIN RESISTANT STAPHYLOCOCCUS AUREUS (A)  Final   Organism ID, Bacteria ENTEROCOCCUS FAECALIS (A)  Final      Susceptibility   Enterococcus faecalis - MIC*    AMPICILLIN <=2 SENSITIVE Sensitive     NITROFURANTOIN <=16 SENSITIVE Sensitive     VANCOMYCIN 1 SENSITIVE Sensitive     * 50,000 COLONIES/mL ENTEROCOCCUS FAECALIS   Methicillin resistant staphylococcus aureus - MIC*    CIPROFLOXACIN >=8 RESISTANT Resistant     GENTAMICIN <=0.5 SENSITIVE Sensitive     NITROFURANTOIN <=16 SENSITIVE Sensitive     OXACILLIN >=4 RESISTANT Resistant     TETRACYCLINE >=16 RESISTANT Resistant     VANCOMYCIN <=0.5 SENSITIVE Sensitive     TRIMETH/SULFA >=320 RESISTANT Resistant     CLINDAMYCIN RESISTANT Resistant     RIFAMPIN <=0.5 SENSITIVE Sensitive     Inducible Clindamycin POSITIVE Resistant     * >=100,000 COLONIES/mL METHICILLIN RESISTANT STAPHYLOCOCCUS AUREUS  Culture, blood (routine x 2)     Status: None (Preliminary result)   Collection Time: 03/22/21  1:48 PM   Specimen: BLOOD  Result Value Ref Range Status   Specimen Description   Final    BLOOD BLOOD LEFT FOREARM Performed at [facility], [address], [place], [place] [address]    Special Requests   Final     BOTTLES DRAWN AEROBIC AND ANAEROBIC Blood Culture adequate volume Performed at [facility], [address], [place], [place] [address]    Culture   Final    NO GROWTH 2 DAYS Performed at [facility], [address], [place], [place] [address]    Report Status PENDING  Incomplete  Culture, blood (routine x 2)     Status: None (Preliminary result)  Collection Time: 03/22/21  1:48 PM   Specimen: BLOOD  Result Value Ref Range Status   Specimen Description   Final    BLOOD RIGHT ANTECUBITAL Performed at [place] [address], [place], [place] [address]    Special Requests   Final    BOTTLES DRAWN AEROBIC AND ANAEROBIC Blood Culture adequate volume Performed at [place] [address] [place], [place] [address]    Culture   Final    NO GROWTH 2 DAYS Performed at [facility] [address] [place], [place] [address]    Report Status PENDING  Incomplete  Resp Panel by RT-PCR (Flu A&B, Covid) Nasopharyngeal Swab     Status: None   Collection Time: 03/23/21  6:10 PM   Specimen: Nasopharyngeal Swab; Nasopharyngeal(NP) swabs in vial transport medium  Result Value Ref Range Status   SARS Coronavirus 2 by RT PCR NEGATIVE NEGATIVE Final    Comment: (NOTE) SARS-CoV-2 target nucleic acids are NOT DETECTED.  The SARS-CoV-2 RNA is generally detectable in upper respiratory specimens during the acute phase of infection. The lowest concentration of SARS-CoV-2 viral copies this assay can detect is 138 copies/mL. A negative result does not preclude SARS-Cov-2 infection and should not be used as the sole basis for treatment or other patient management decisions. A negative result may occur with  improper specimen collection/handling, submission of specimen other than nasopharyngeal swab, presence of viral mutation(s) within the areas targeted by this assay, and inadequate number of viral copies(<138  copies/mL). A negative result must be combined with clinical observations, patient history, and epidemiological information. The expected result is Negative.  Fact Sheet for Patients:  EntrepreneurPulse.com.au  Fact Sheet for Healthcare Providers:  IncredibleEmployment.be  This test is no t yet approved or cleared by the [place] FDA and  has been authorized for detection and/or diagnosis of SARS-CoV-2 by FDA under an Emergency Use Authorization (EUA). This EUA will remain  in effect (meaning this test can be used) for the duration of the COVID-19 declaration under Section 564(b)(1) of the Act, 21 U.S.C.section 360bbb-3(b)(1), unless the authorization is terminated  or revoked sooner.       Influenza A by PCR NEGATIVE NEGATIVE Final   Influenza B by PCR NEGATIVE NEGATIVE Final    Comment: (NOTE) The Xpert Xpress SARS-CoV-2/FLU/RSV plus assay is intended as an aid in the diagnosis of influenza from Nasopharyngeal swab specimens and should not be used as a sole basis for treatment. Nasal washings and aspirates are unacceptable for Xpert Xpress SARS-CoV-2/FLU/RSV testing.  Fact Sheet for Patients: EntrepreneurPulse.com.au  Fact Sheet for Healthcare Providers: IncredibleEmployment.be  This test is not yet approved or cleared by the [place] FDA and has been authorized for detection and/or diagnosis of SARS-CoV-2 by FDA under an Emergency Use Authorization (EUA). This EUA will remain in effect (meaning this test can be used) for the duration of the COVID-19 declaration under Section 564(b)(1) of the Act, 21 U.S.C. section 360bbb-3(b)(1), unless the authorization is terminated or revoked.  Performed at [facility], [place] [address] [place], [place] [address]      Serology:    Imaging: If present, new imagings (plain films, ct scans, and mri) have been personally  visualized and interpreted; radiology reports have been reviewed. Decision making incorporated into the Impression / Recommendations.  1/26 mri left hip 1. Acute avulsion fracture of the left ischial tuberosity with an approximately 4.0 x 1.2 cm minimally displaced fracture fragment.  2. Near-complete non-retracted tears of the left hamstring tendon origin.    Jabier Mutton, [place] [facility] [place] [phone] pager    03/24/2021, 2:26 PM

## 2021-03-24 NOTE — Progress Notes (Addendum)
Pharmacy Antibiotic Note  Kim Gonzalez is a 88 y.o. female admitted on 03/19/2021 with UTI.  Pharmacy has been consulted for vancomycin  dosing. Pt is being started on vancomycin after positive urine cultures for Staph aureus and Enterococcus faecalis. Note presence of watchman device for Afib.  Today, 03/24/2021: D3 abx WBC/Temps remain stable WNL SCr now back down to near-baseline Staph in urine cultured as MRSA resistant to most oral agents; however BCx remain clear  Plan: Increase vancomycin back to 750 mg IV q24 hr (eAUC 493 w/ SCr 1.05; Vd 0.5) SCr daily while on vanc Limited PO options - not good candidate for Zyvox d/t multiple serotonergic/dopaminergic medications. Could consider fosfomycin, although less reliable given unable to confirm sensitivities   Height: 5' 3.5" (161.3 cm) Weight: 104.4 kg (230 lb 2.6 oz) IBW/kg (Calculated) : 53.55  Temp (24hrs), Avg:98 F (36.7 C), Min:98 F (36.7 C), Max:98 F (36.7 C)  Recent Labs  Lab 03/19/21 1820 03/20/21 0327 03/23/21 0324 03/24/21 0303  WBC 8.0 8.1 6.6 8.0  CREATININE 1.28*   1.26* 1.29* 1.40* 1.05*     Estimated Creatinine Clearance: 41.5 mL/min (A) (by C-G formula based on SCr of 1.05 mg/dL (H)).    Allergies  Allergen Reactions   Amoxicillin Diarrhea   Aspirin     unknown   Ciprofibrate     unknown   Dilantin [Phenytoin]     unknown   Doxycycline Hives   Erythromycin     Other reaction(s): UPSET STOMACH   Fentanyl     Other reaction(s): altered mental states   Ibuprofen     Upset stomach    Lovastatin     unknown   Macrodantin [Nitrofurantoin]     unknown   Oxycodone     Other reaction(s): unbalanced   Oxycodone-Acetaminophen     Upset stomach    Sulfamethoxazole-Trimethoprim     Stomach pain    Antimicrobials this admission:  1/28 ceftriaxone >> 1/29 1/29 vancomycin >>   Dose adjustments this admission:  - 1/30 reduce vanc 750 >> 500 q24 for worsening SCr - 1/31 increase back to 750  q24 w/ improved SCr  Microbiology results:  1/29 BCx: ngtd 1/27 UCx: > 100k MRSA (S-vanc, NTF only), 50k colonies E. Faecalis (S-Amp, Vanc)  Thank you for allowing pharmacy to be a part of this patients care.   Bernadene Person, PharmD, BCPS [phone] 03/24/2021, 9:47 AM

## 2021-03-27 LAB — CULTURE, BLOOD (ROUTINE X 2)
Culture: NO GROWTH
Culture: NO GROWTH
Special Requests: ADEQUATE
Special Requests: ADEQUATE

## 2022-06-30 IMAGING — CR DG CHEST 2V
2 series · 2 of 2 positions shown · non-contrast
Comparison: None.

CLINICAL DATA: Weakness

EXAM:
CHEST - 2 VIEW

[w chest lat]
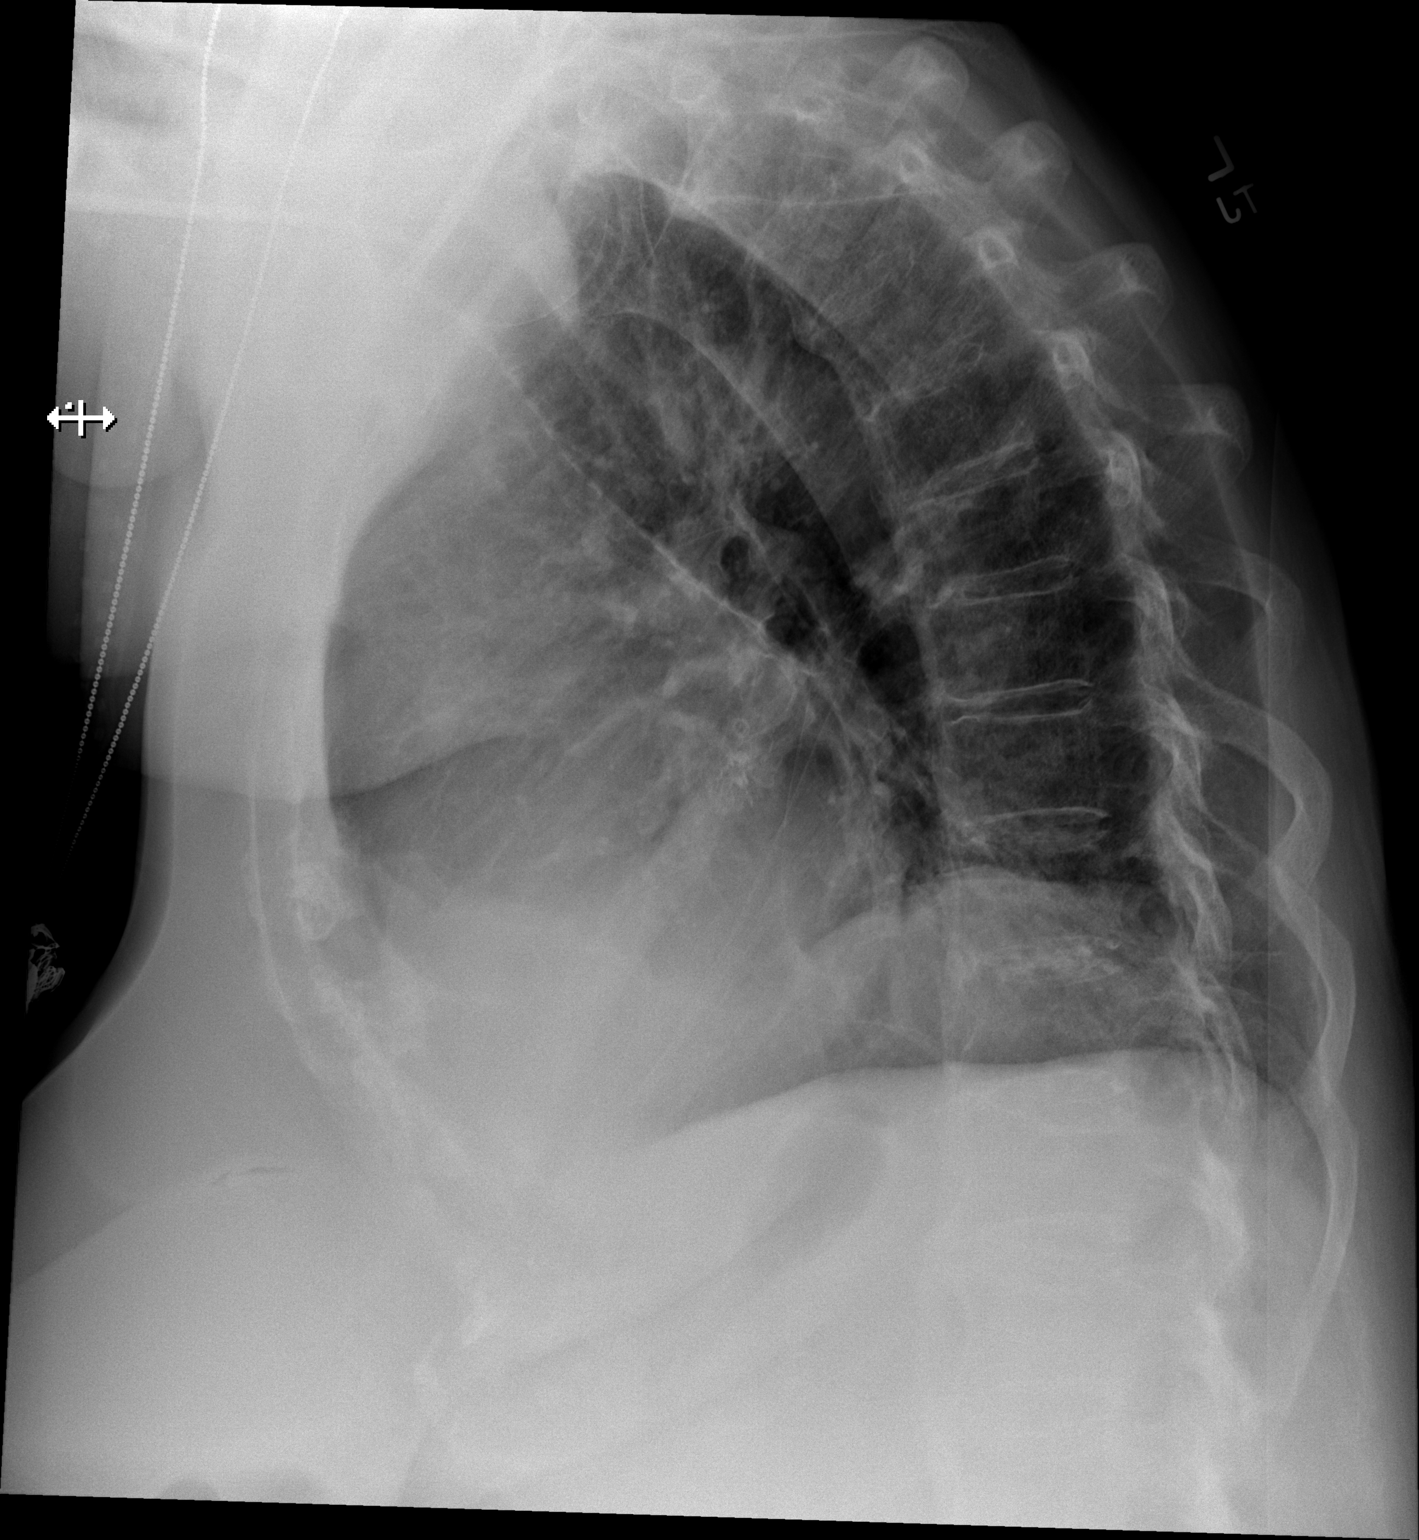

[x chest ap]
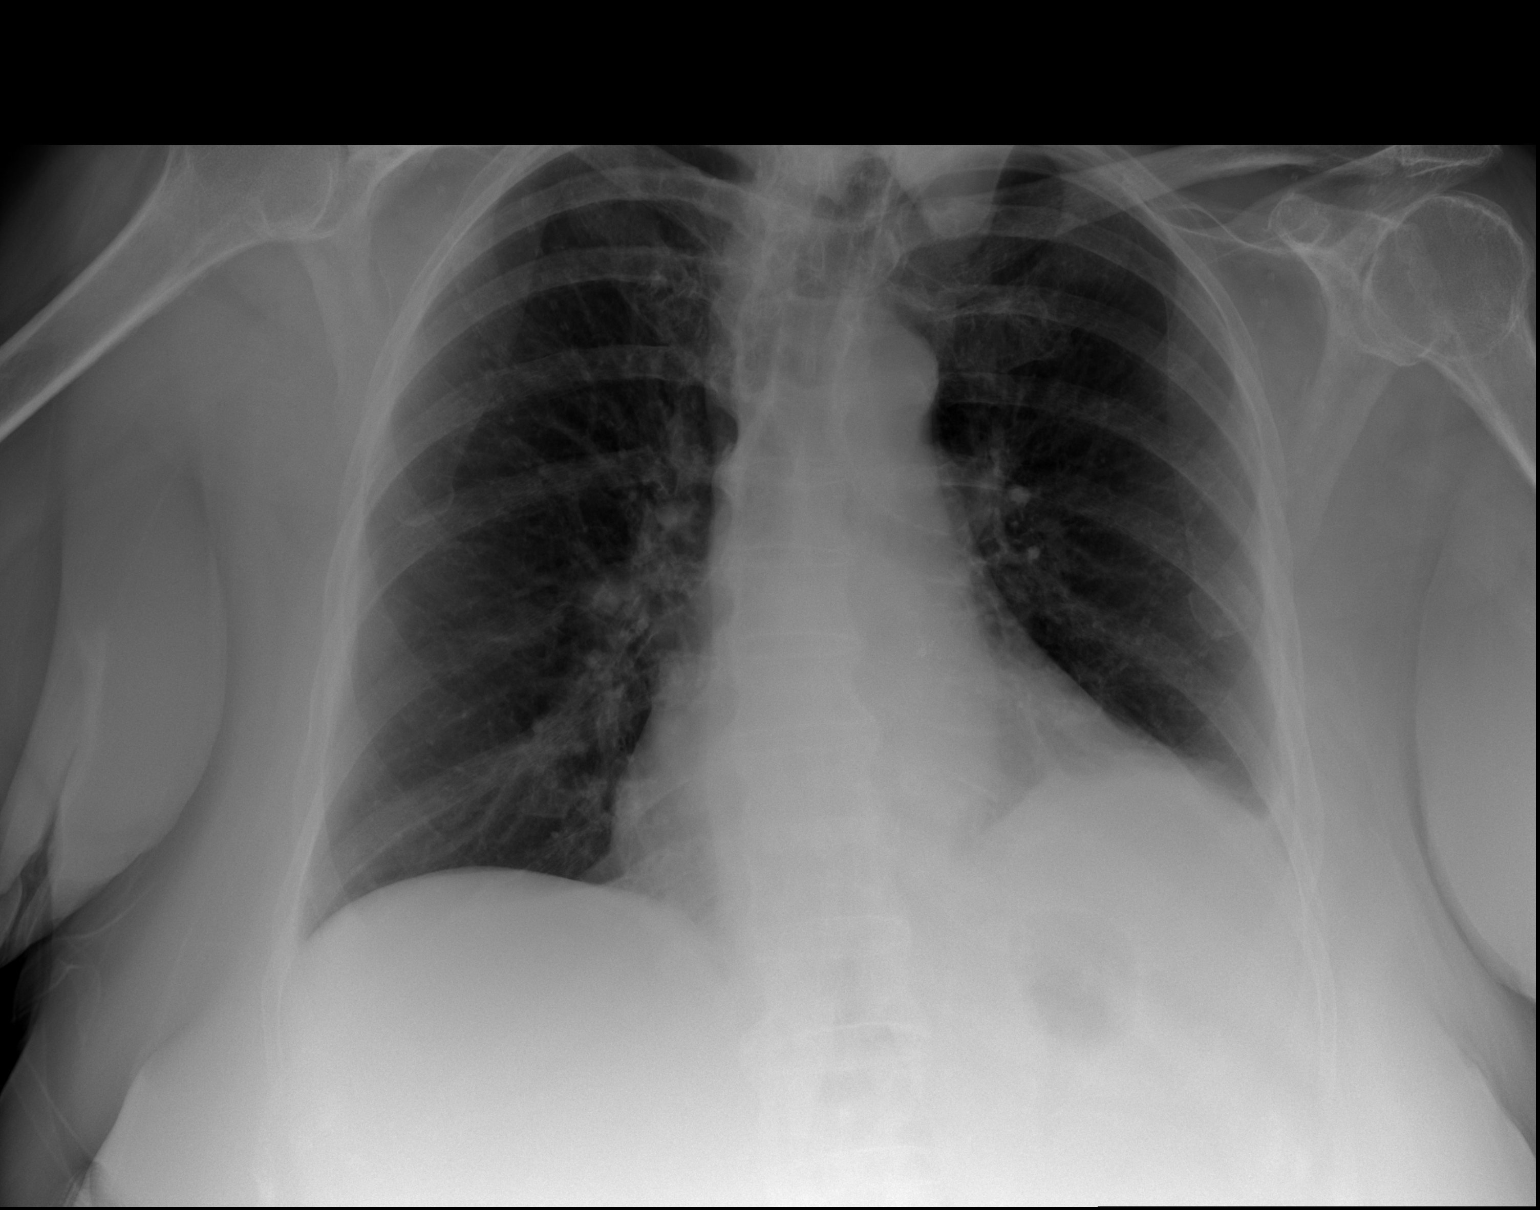

[2 of 2 positions shown; findings below may reference images not displayed]

FINDINGS: The heart size and mediastinal contours are within normal limits.
Elevation of the left hemidiaphragm with associated scarring or
atelectasis. No acute appearing airspace opacity. The visualized
skeletal structures are unremarkable.
IMPRESSION: Elevation of the left hemidiaphragm with associated scarring or
atelectasis. No acute appearing airspace opacity.

## 2022-09-17 IMAGING — MR MR HIP*L* W/O CM
6 series · 35 of 40 positions shown · non-contrast
Comparison: X-ray 03/19/2021

CLINICAL DATA: Left hip pain.  Fall 5 days ago

EXAM:
MR OF THE LEFT HIP WITHOUT CONTRAST
TECHNIQUE: Multiplanar, multisequence MR imaging was performed. No intravenous
contrast was administered.

[Series 5: T1 · coronal · left · 3.0mm · 0.78mm/px · 3 of 28 slices shown]
[im 1/28]
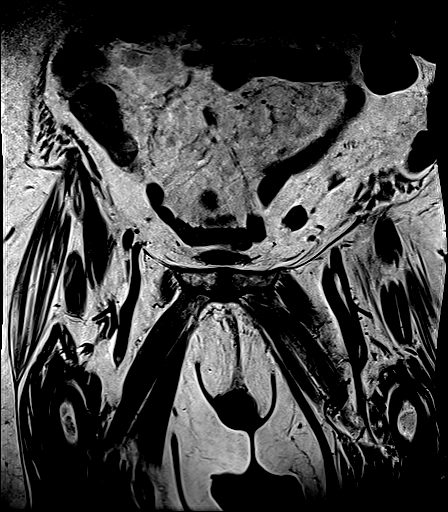
[im 6/28]
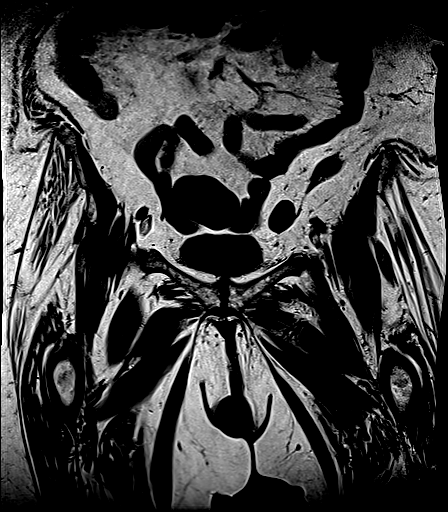
[im 11/28]
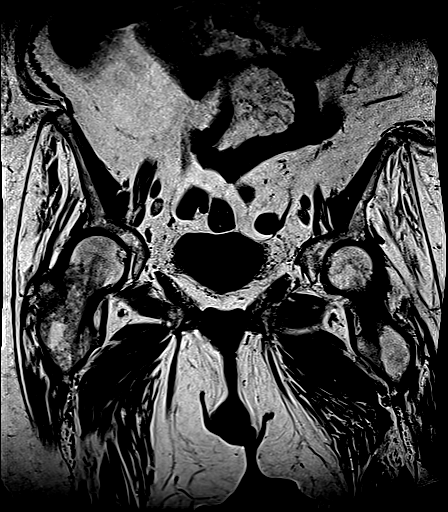

[Series 6: T2 fat-sat · coronal · left · 3.0mm · 0.78mm/px · 6 of 28 slices shown (1 of 2)]
[im 1/28]
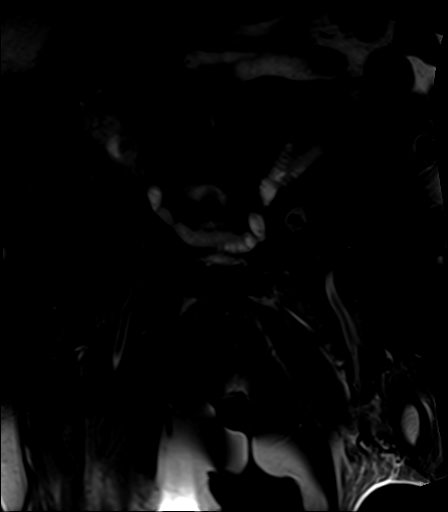
[im 6/28]
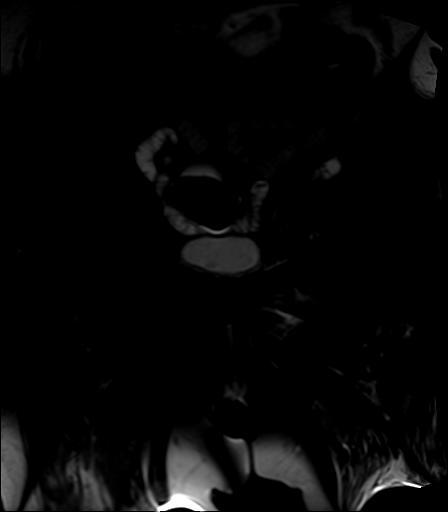
[im 11/28]
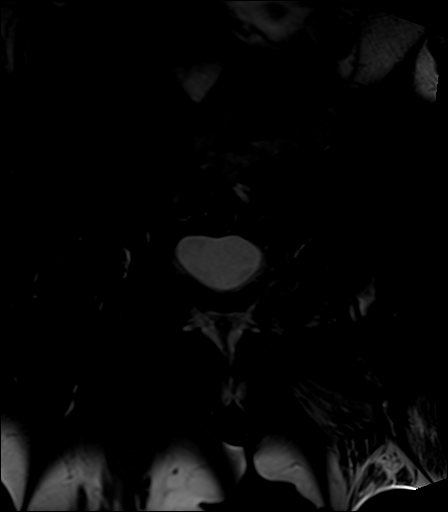
[im 17/28]
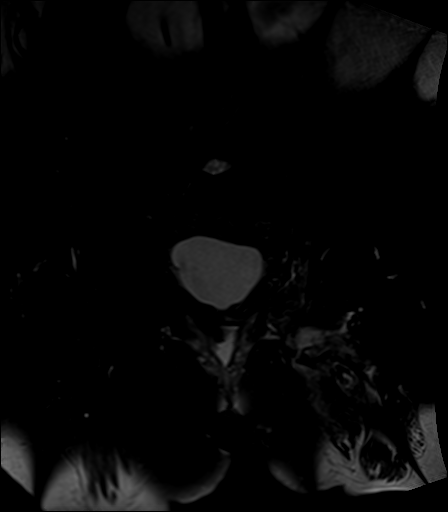
[im 22/28]
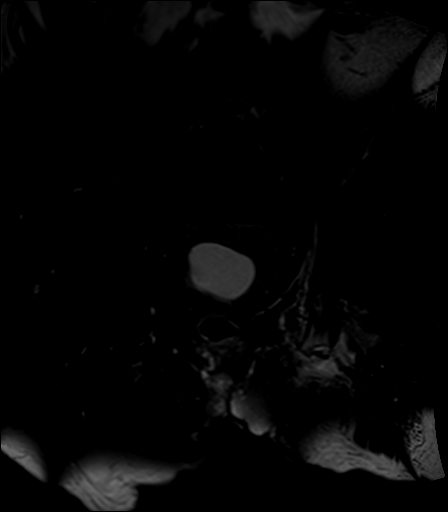
[im 28/28]
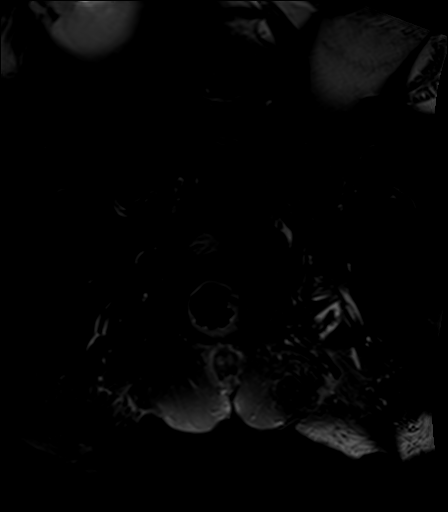

[Series 7: T2 fat-sat · axial · left · 4.0mm · 1.12mm/px · z∈[-99,+126]mm · 8 of 46 slices shown (2 of 2)]
[im 1/46]
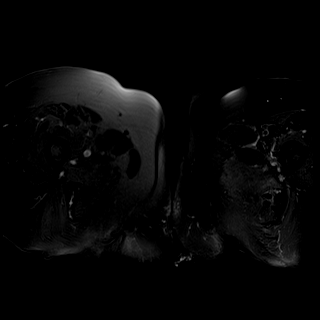
[im 6/46]
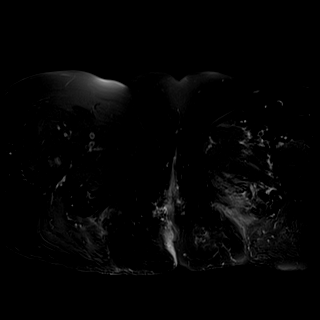
[im 16/46]
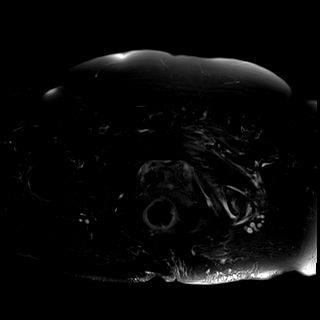
[im 21/46]
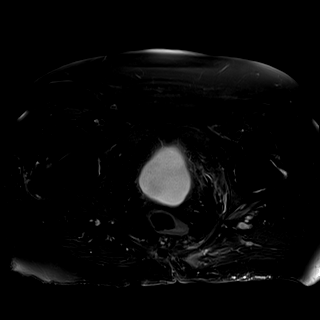
[im 26/46]
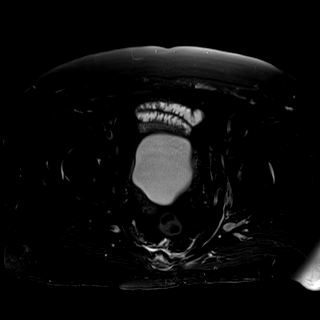
[im 31/46]
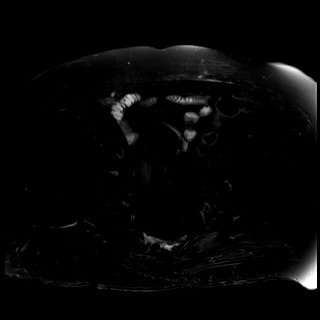
[im 41/46]
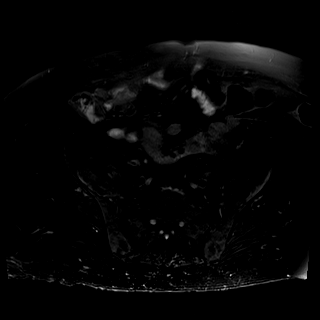
[im 46/46]
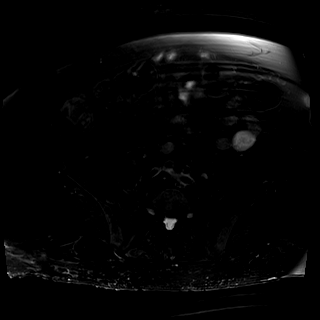

[Series 8: PD fat-sat · coronal · left · 4.0mm · 0.56mm/px · 4 of 20 slices shown (1 of 2)]
[im 1/20]
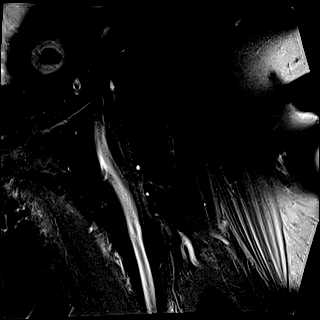
[im 7/20]
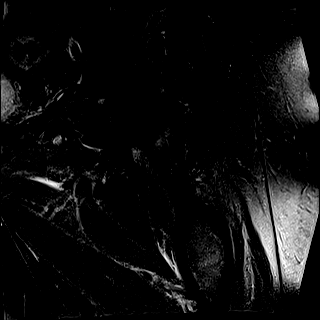
[im 13/20]
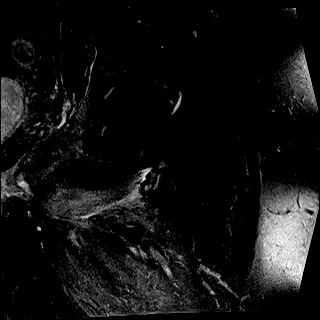
[im 20/20]
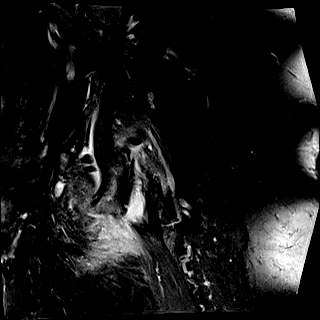

[Series 9: PD · axial · left · 4.0mm · 0.56mm/px · z∈[-127,+63]mm · 8 of 39 slices shown]
[im 1/39]
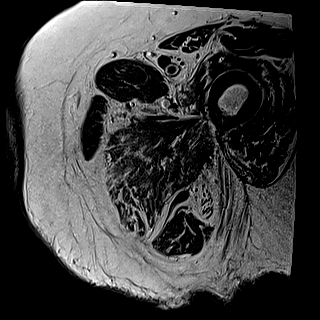
[im 6/39]
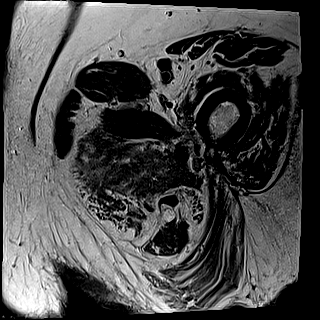
[im 11/39]
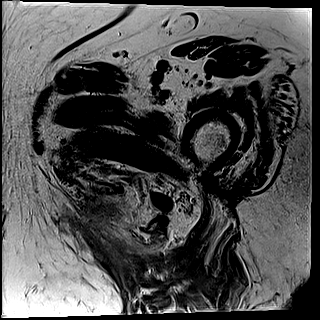
[im 17/39]
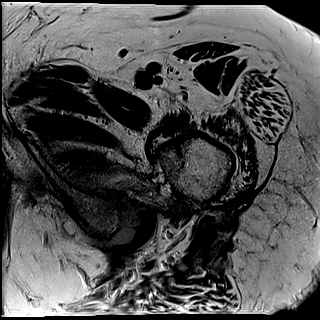
[im 22/39]
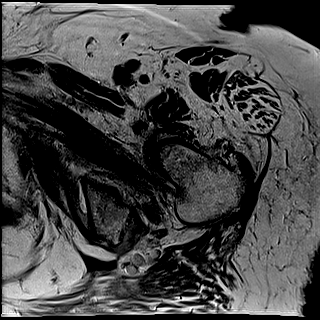
[im 28/39]
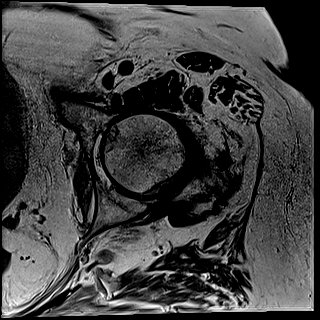
[im 33/39]
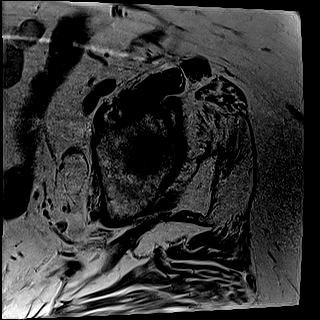
[im 39/39]
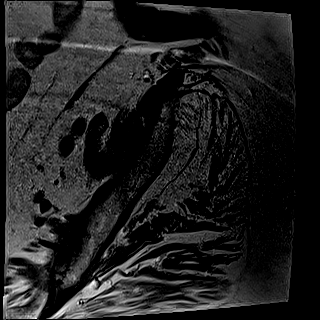

[Series 10: PD fat-sat · sagittal · left · 4.0mm · 0.56mm/px · 6 of 30 slices shown (2 of 2)]
[im 1/30]
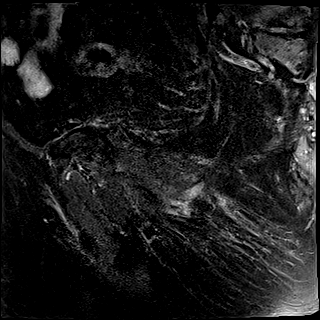
[im 6/30]
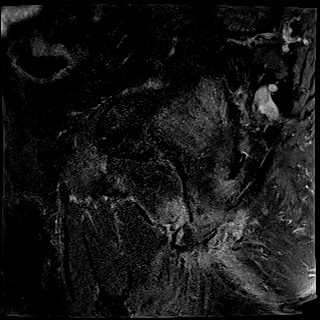
[im 12/30]
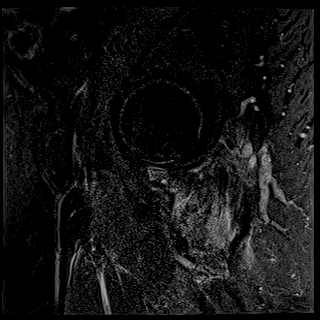
[im 18/30]
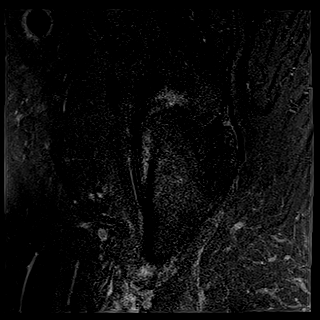
[im 24/30]
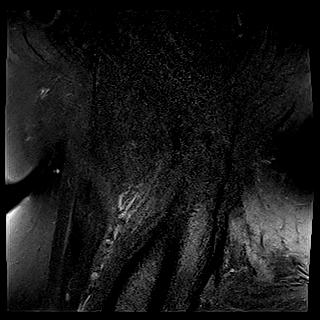
[im 30/30]
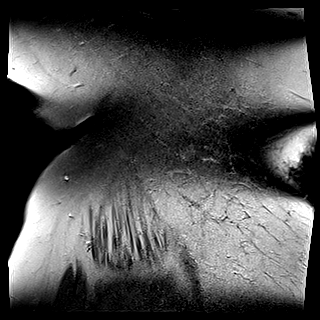

[35 of 40 positions shown; findings below may reference images not displayed]

FINDINGS: Bones: Acute avulsion fracture of the left ischial tuberosity with
an approximately 4.0 x 1.2 cm minimally displaced fracture fragment
(series 8, image 15). Marked bone marrow edema within the left
ischium. Near-complete non-retracted tears of the left hamstring
tendon origin.

No additional fracture. No dislocation. No femoral head avascular
necrosis. No pelvic diastasis. Degenerative disc disease of the
visualized lumbar spine. No marrow replacing bone lesion.

Articular cartilage and labrum

Articular cartilage:  Mild chondral thinning.

Labrum:  Grossly intact on non-arthrographic imaging.

Joint or bursal effusion

Joint effusion:  None.

Bursae: No abnormal bursal fluid collection.

Muscles and tendons

Muscles and tendons: Near complete left hamstring avulsion. The
gluteal, iliopsoas, rectus femoris, and adductor tendons appear
intact. Generalized muscle atrophy. Fatty infiltration of the
bilateral gluteal musculature.

Other findings

Miscellaneous: Small ill-defined hematoma at the fracture site. No
inguinal lymphadenopathy. Scattered diverticular changes within the
visualized sigmoid colon.
IMPRESSION: 1. Acute avulsion fracture of the left ischial tuberosity with an
approximately 4.0 x 1.2 cm minimally displaced fracture fragment.
2. Near-complete non-retracted tears of the left hamstring tendon
origin.

## 2022-09-17 IMAGING — CR DG HIP (WITH OR WITHOUT PELVIS) 2-3V*L*
3 series · 3 of 3 positions shown · non-contrast
Comparison: None.

CLINICAL DATA: Status post fall, left hip pain

EXAM:
DG HIP (WITH OR WITHOUT PELVIS) 2-3V LEFT

[x pelvis (1 of 2)]
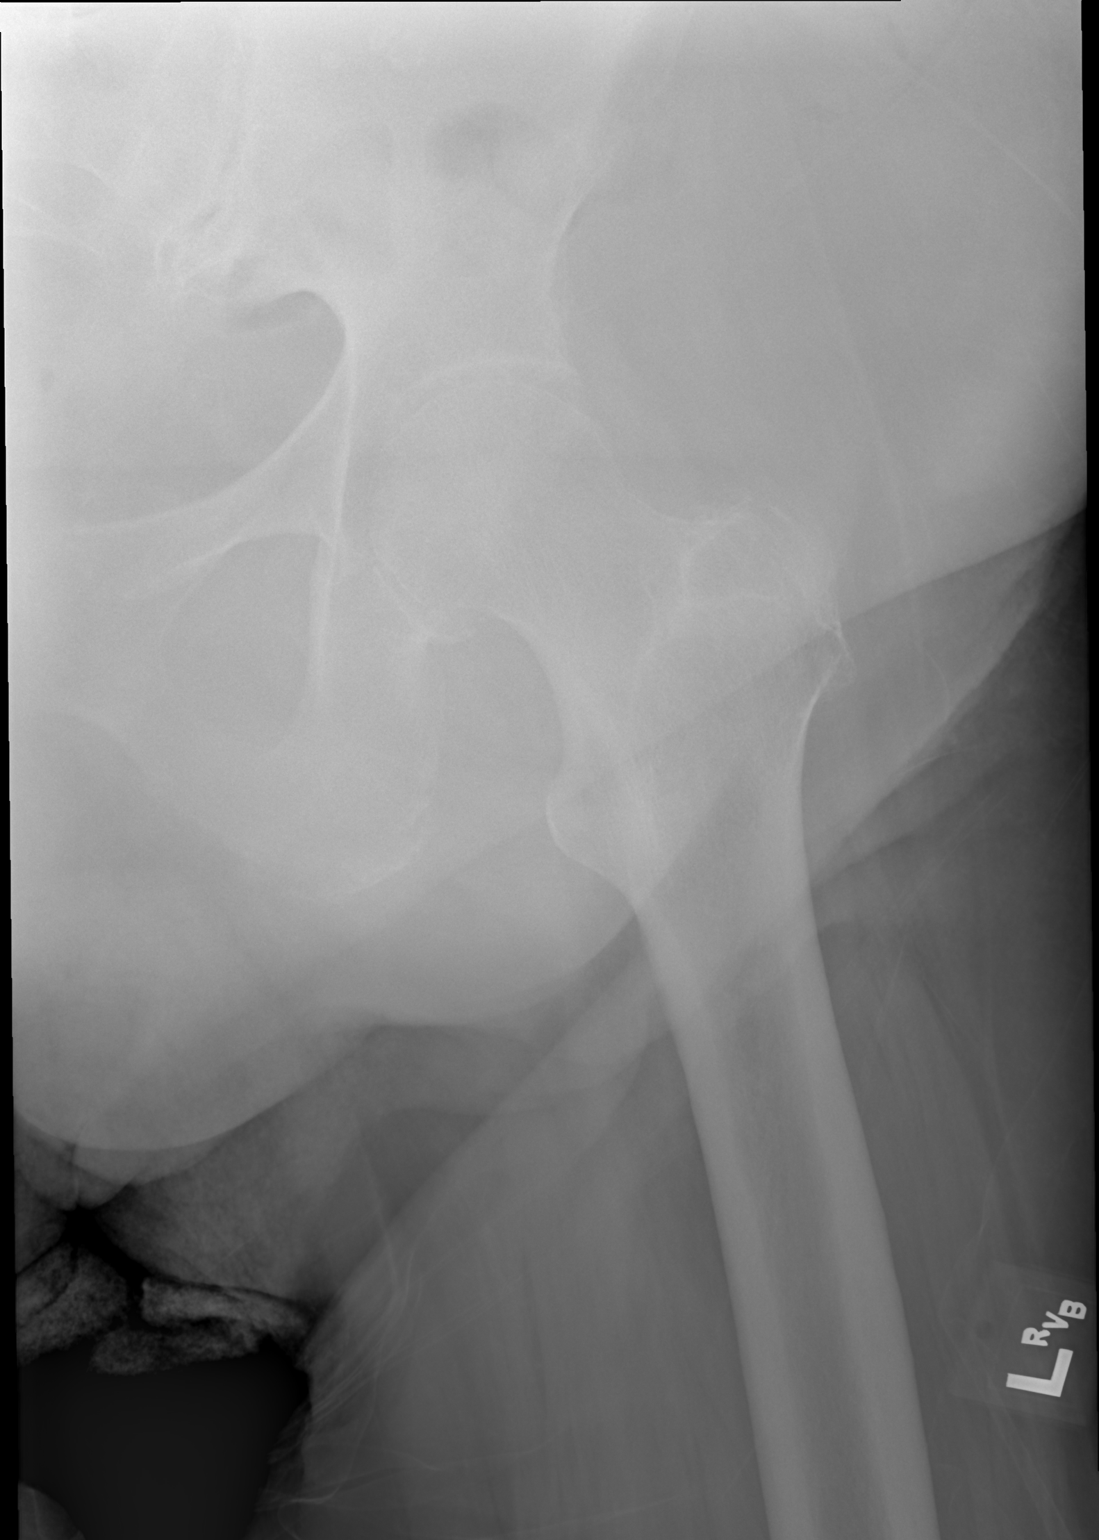

[x hip ap left]
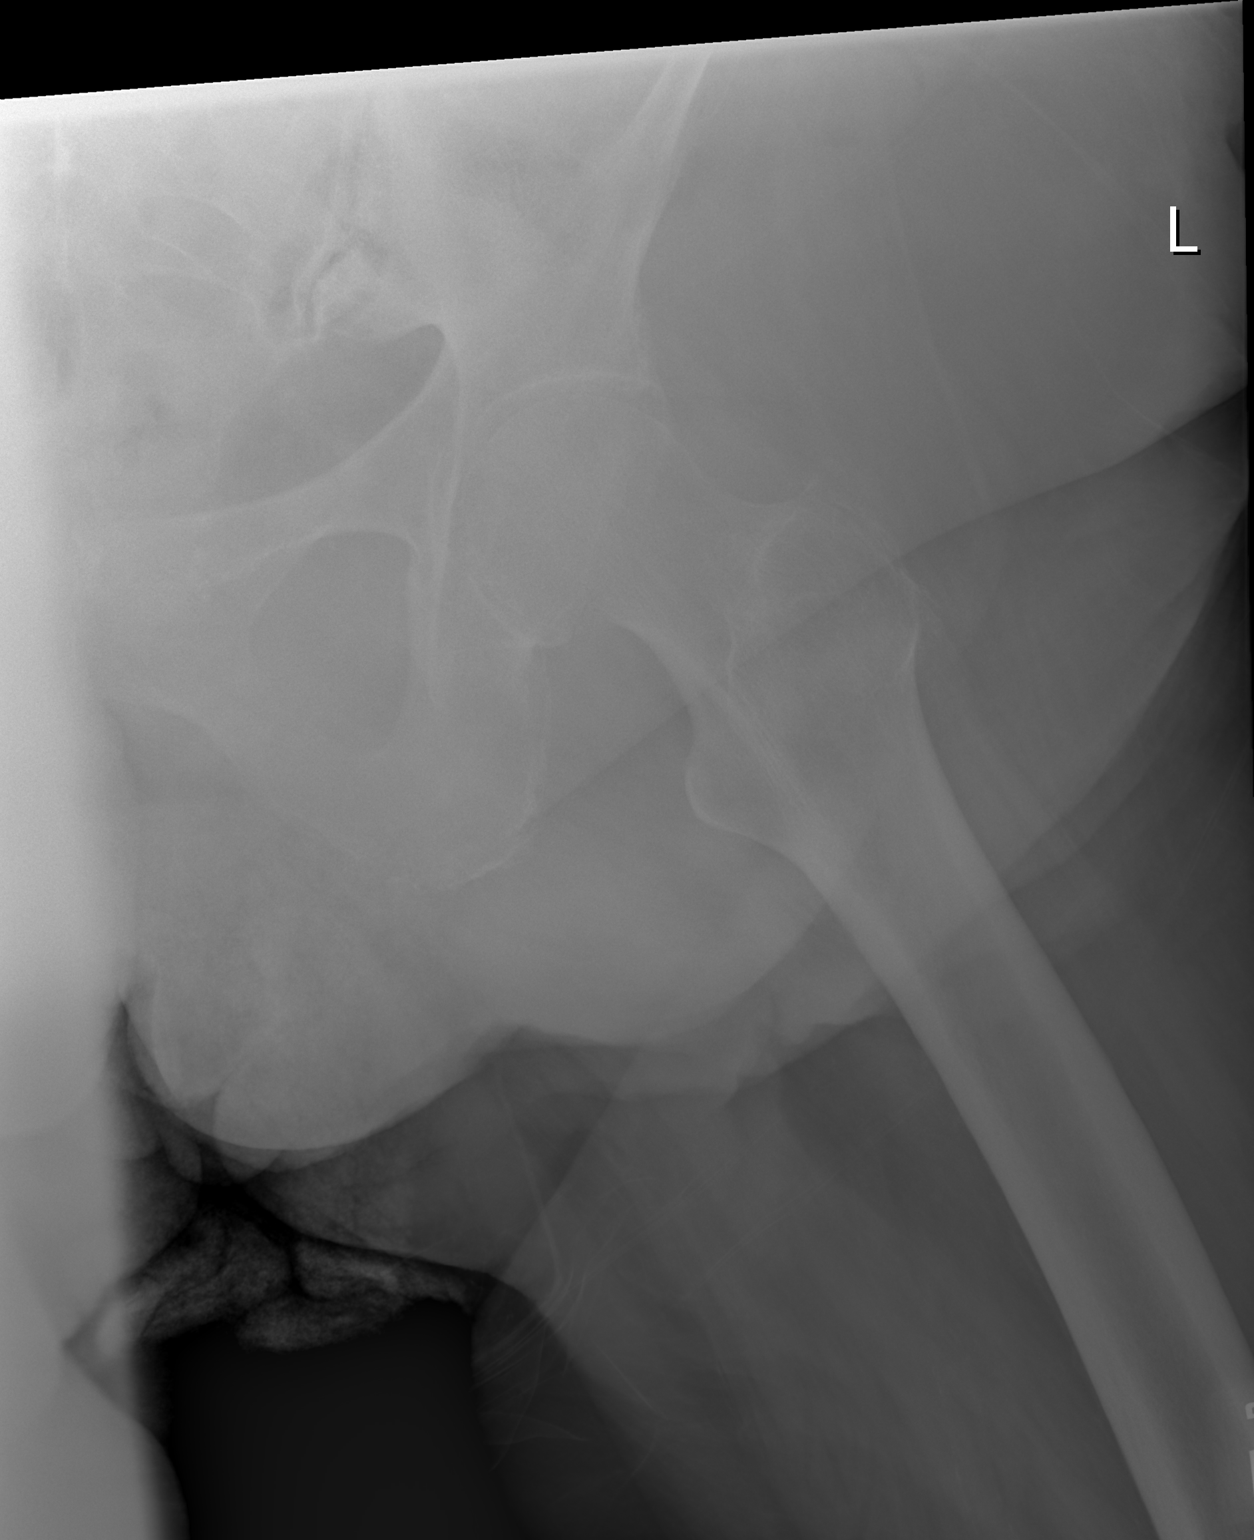

[x pelvis (2 of 2)]
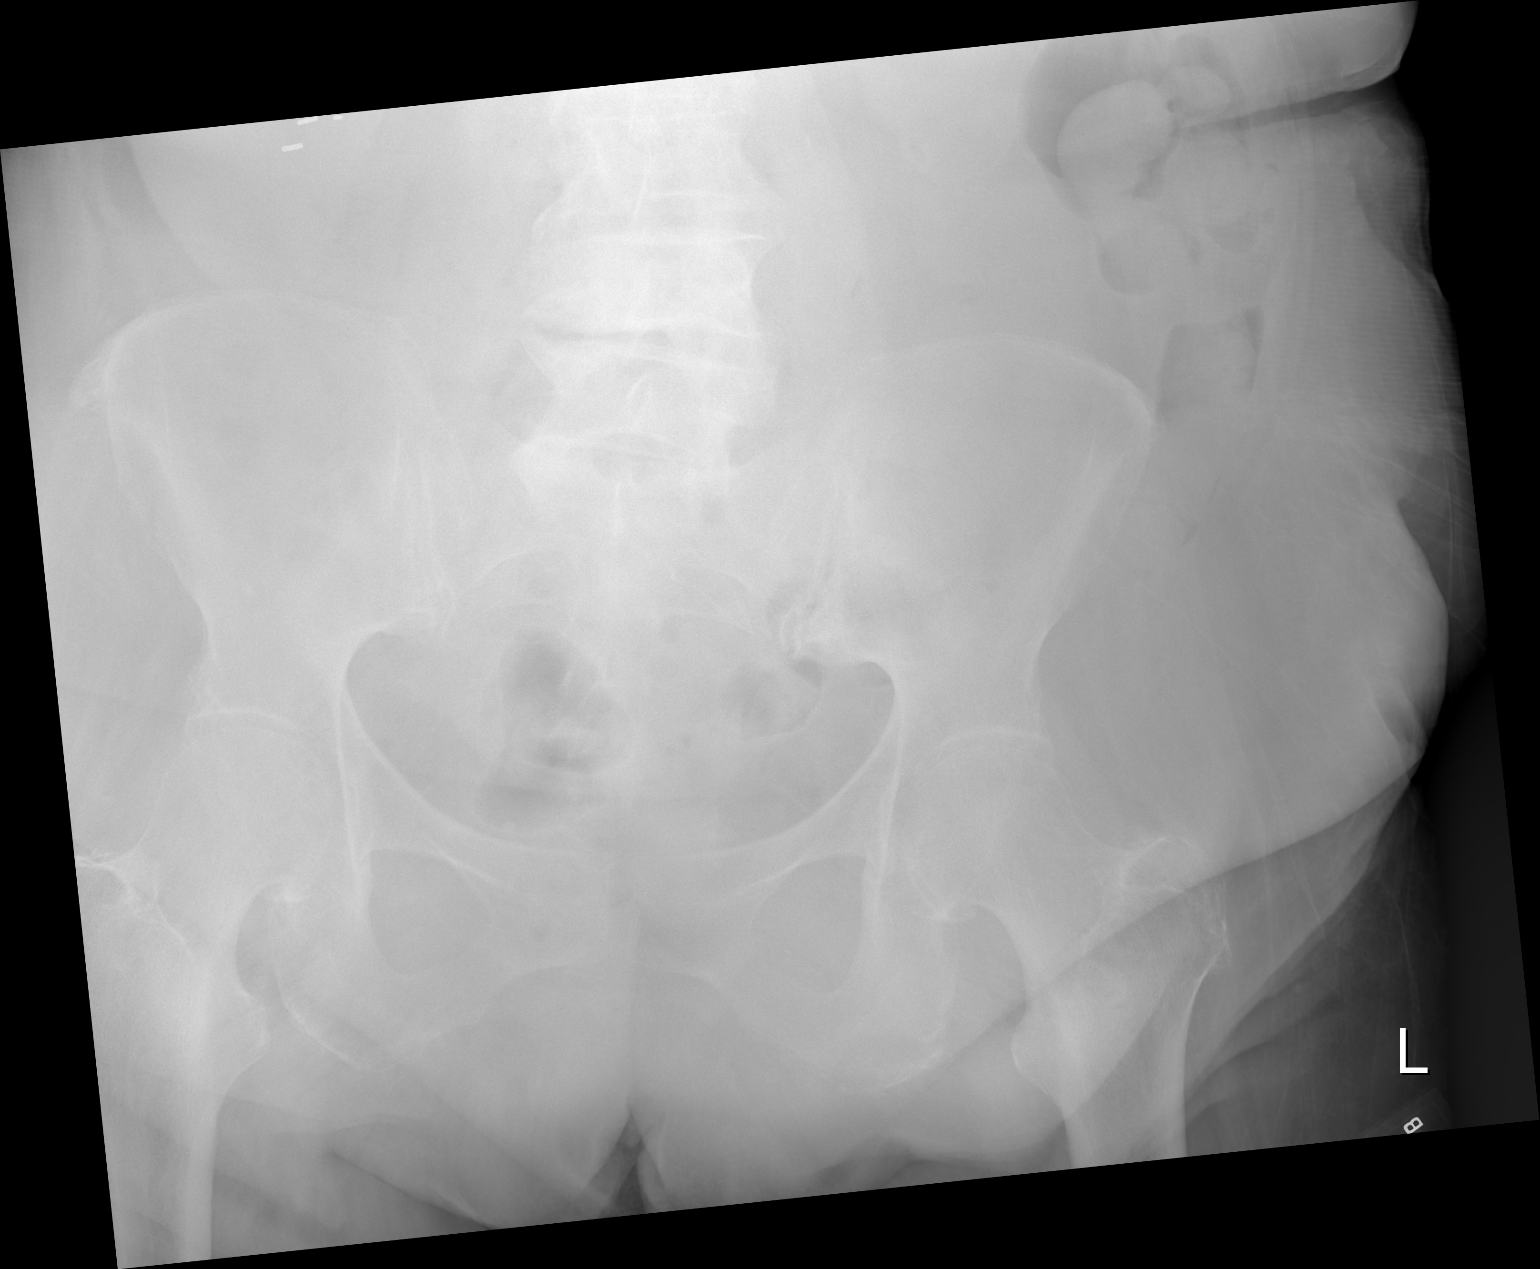

[3 of 3 positions shown; findings below may reference images not displayed]

FINDINGS: No acute fracture or dislocation. No aggressive osseous lesion.
Normal alignment. Lower lumbar spine spondylosis

Soft tissue are unremarkable. No radiopaque foreign body or soft
tissue emphysema.
IMPRESSION: No acute osseous injury of the left hip.

## 2022-11-23 DEATH — deceased
